# Patient Record
Sex: Male | Born: 1945
Health system: Southern US, Community
[De-identification: ages and names within clinical notes are randomized; demographics above are authoritative.]

## PROBLEM LIST (undated history)

## (undated) DIAGNOSIS — J449 Chronic obstructive pulmonary disease, unspecified: Secondary | ICD-10-CM

## (undated) DIAGNOSIS — I10 Essential (primary) hypertension: Secondary | ICD-10-CM

## (undated) DIAGNOSIS — J45909 Unspecified asthma, uncomplicated: Secondary | ICD-10-CM

## (undated) HISTORY — PX: TONSILLECTOMY: SUR1361

---

## 2006-12-04 ENCOUNTER — Ambulatory Visit: Payer: Self-pay | Admitting: Gastroenterology

## 2014-09-25 ENCOUNTER — Emergency Department: Payer: Self-pay | Admitting: Emergency Medicine

## 2014-09-25 LAB — BASIC METABOLIC PANEL WITH GFR
Anion Gap: 9
BUN: 21 mg/dL — ABNORMAL HIGH
Calcium, Total: 9.1 mg/dL
Chloride: 102 mmol/L
Co2: 27 mmol/L
Creatinine: 1.07 mg/dL
EGFR (African American): >60
EGFR (Non-African Amer.): >60
Glucose: 99 mg/dL
Osmolality: 279
Potassium: 3.5 mmol/L
Sodium: 138 mmol/L

## 2014-09-25 LAB — CBC
HCT: 49.5 %
HGB: 16.7 g/dL
MCH: 30.5 pg
MCHC: 33.7 g/dL
MCV: 91 fL
Platelet: 214 10*3/uL
RBC: 5.46 x10 6/mm 3
RDW: 12.8 %
WBC: 18 10*3/uL — ABNORMAL HIGH

## 2014-09-25 LAB — TROPONIN I: Troponin-I: <0.02 ng/mL

## 2015-08-21 DIAGNOSIS — J449 Chronic obstructive pulmonary disease, unspecified: Secondary | ICD-10-CM | POA: Diagnosis not present

## 2015-08-21 DIAGNOSIS — J45909 Unspecified asthma, uncomplicated: Secondary | ICD-10-CM | POA: Diagnosis not present

## 2015-09-21 DIAGNOSIS — J45909 Unspecified asthma, uncomplicated: Secondary | ICD-10-CM | POA: Diagnosis not present

## 2015-09-21 DIAGNOSIS — J449 Chronic obstructive pulmonary disease, unspecified: Secondary | ICD-10-CM | POA: Diagnosis not present

## 2015-10-19 DIAGNOSIS — J449 Chronic obstructive pulmonary disease, unspecified: Secondary | ICD-10-CM | POA: Diagnosis not present

## 2015-10-19 DIAGNOSIS — J45909 Unspecified asthma, uncomplicated: Secondary | ICD-10-CM | POA: Diagnosis not present

## 2015-11-06 DIAGNOSIS — E7800 Pure hypercholesterolemia, unspecified: Secondary | ICD-10-CM | POA: Diagnosis not present

## 2015-11-06 DIAGNOSIS — Z79899 Other long term (current) drug therapy: Secondary | ICD-10-CM | POA: Diagnosis not present

## 2015-11-15 DIAGNOSIS — Z79899 Other long term (current) drug therapy: Secondary | ICD-10-CM | POA: Diagnosis not present

## 2015-11-15 DIAGNOSIS — Z0000 Encounter for general adult medical examination without abnormal findings: Secondary | ICD-10-CM | POA: Diagnosis not present

## 2015-11-15 DIAGNOSIS — E7800 Pure hypercholesterolemia, unspecified: Secondary | ICD-10-CM | POA: Diagnosis not present

## 2015-11-19 DIAGNOSIS — J45909 Unspecified asthma, uncomplicated: Secondary | ICD-10-CM | POA: Diagnosis not present

## 2015-11-19 DIAGNOSIS — J449 Chronic obstructive pulmonary disease, unspecified: Secondary | ICD-10-CM | POA: Diagnosis not present

## 2015-11-30 DIAGNOSIS — H52223 Regular astigmatism, bilateral: Secondary | ICD-10-CM | POA: Diagnosis not present

## 2015-11-30 DIAGNOSIS — H5203 Hypermetropia, bilateral: Secondary | ICD-10-CM | POA: Diagnosis not present

## 2015-11-30 DIAGNOSIS — H524 Presbyopia: Secondary | ICD-10-CM | POA: Diagnosis not present

## 2015-12-13 ENCOUNTER — Encounter: Payer: Self-pay | Admitting: Pharmacist

## 2015-12-13 ENCOUNTER — Encounter (INDEPENDENT_AMBULATORY_CARE_PROVIDER_SITE_OTHER): Payer: Self-pay

## 2015-12-19 DIAGNOSIS — J449 Chronic obstructive pulmonary disease, unspecified: Secondary | ICD-10-CM | POA: Diagnosis not present

## 2015-12-19 DIAGNOSIS — J45909 Unspecified asthma, uncomplicated: Secondary | ICD-10-CM | POA: Diagnosis not present

## 2015-12-20 DIAGNOSIS — J449 Chronic obstructive pulmonary disease, unspecified: Secondary | ICD-10-CM | POA: Diagnosis not present

## 2015-12-20 DIAGNOSIS — J45909 Unspecified asthma, uncomplicated: Secondary | ICD-10-CM | POA: Diagnosis not present

## 2016-01-15 DIAGNOSIS — R972 Elevated prostate specific antigen [PSA]: Secondary | ICD-10-CM | POA: Diagnosis not present

## 2016-01-15 DIAGNOSIS — N401 Enlarged prostate with lower urinary tract symptoms: Secondary | ICD-10-CM | POA: Diagnosis not present

## 2016-01-15 DIAGNOSIS — R339 Retention of urine, unspecified: Secondary | ICD-10-CM | POA: Diagnosis not present

## 2016-01-15 DIAGNOSIS — N138 Other obstructive and reflux uropathy: Secondary | ICD-10-CM | POA: Diagnosis not present

## 2016-05-08 DIAGNOSIS — Z79899 Other long term (current) drug therapy: Secondary | ICD-10-CM | POA: Diagnosis not present

## 2016-05-08 DIAGNOSIS — E7800 Pure hypercholesterolemia, unspecified: Secondary | ICD-10-CM | POA: Diagnosis not present

## 2016-05-15 DIAGNOSIS — J449 Chronic obstructive pulmonary disease, unspecified: Secondary | ICD-10-CM | POA: Diagnosis not present

## 2016-05-15 DIAGNOSIS — Z79899 Other long term (current) drug therapy: Secondary | ICD-10-CM | POA: Diagnosis not present

## 2016-05-15 DIAGNOSIS — J301 Allergic rhinitis due to pollen: Secondary | ICD-10-CM | POA: Diagnosis not present

## 2016-05-15 DIAGNOSIS — I10 Essential (primary) hypertension: Secondary | ICD-10-CM | POA: Diagnosis not present

## 2016-05-15 DIAGNOSIS — N401 Enlarged prostate with lower urinary tract symptoms: Secondary | ICD-10-CM | POA: Diagnosis not present

## 2016-05-15 DIAGNOSIS — Z23 Encounter for immunization: Secondary | ICD-10-CM | POA: Diagnosis not present

## 2016-05-15 DIAGNOSIS — Z125 Encounter for screening for malignant neoplasm of prostate: Secondary | ICD-10-CM | POA: Diagnosis not present

## 2016-05-15 DIAGNOSIS — E7800 Pure hypercholesterolemia, unspecified: Secondary | ICD-10-CM | POA: Diagnosis not present

## 2016-07-24 DIAGNOSIS — J4520 Mild intermittent asthma, uncomplicated: Secondary | ICD-10-CM | POA: Diagnosis not present

## 2016-11-11 DIAGNOSIS — Z125 Encounter for screening for malignant neoplasm of prostate: Secondary | ICD-10-CM | POA: Diagnosis not present

## 2016-11-11 DIAGNOSIS — E7800 Pure hypercholesterolemia, unspecified: Secondary | ICD-10-CM | POA: Diagnosis not present

## 2016-11-11 DIAGNOSIS — Z79899 Other long term (current) drug therapy: Secondary | ICD-10-CM | POA: Diagnosis not present

## 2016-11-18 DIAGNOSIS — Z0000 Encounter for general adult medical examination without abnormal findings: Secondary | ICD-10-CM | POA: Diagnosis not present

## 2017-02-23 DIAGNOSIS — J449 Chronic obstructive pulmonary disease, unspecified: Secondary | ICD-10-CM | POA: Diagnosis not present

## 2017-02-23 DIAGNOSIS — J310 Chronic rhinitis: Secondary | ICD-10-CM | POA: Diagnosis not present

## 2017-02-23 DIAGNOSIS — R05 Cough: Secondary | ICD-10-CM | POA: Diagnosis not present

## 2017-02-23 DIAGNOSIS — K219 Gastro-esophageal reflux disease without esophagitis: Secondary | ICD-10-CM | POA: Diagnosis not present

## 2017-04-13 DIAGNOSIS — E7800 Pure hypercholesterolemia, unspecified: Secondary | ICD-10-CM | POA: Diagnosis not present

## 2017-04-13 DIAGNOSIS — I10 Essential (primary) hypertension: Secondary | ICD-10-CM | POA: Diagnosis not present

## 2017-04-13 DIAGNOSIS — H35033 Hypertensive retinopathy, bilateral: Secondary | ICD-10-CM | POA: Diagnosis not present

## 2017-04-13 DIAGNOSIS — H25813 Combined forms of age-related cataract, bilateral: Secondary | ICD-10-CM | POA: Diagnosis not present

## 2017-04-22 DIAGNOSIS — Z8719 Personal history of other diseases of the digestive system: Secondary | ICD-10-CM | POA: Diagnosis not present

## 2017-04-22 DIAGNOSIS — J310 Chronic rhinitis: Secondary | ICD-10-CM | POA: Diagnosis not present

## 2017-04-22 DIAGNOSIS — R05 Cough: Secondary | ICD-10-CM | POA: Diagnosis not present

## 2017-04-22 DIAGNOSIS — J449 Chronic obstructive pulmonary disease, unspecified: Secondary | ICD-10-CM | POA: Diagnosis not present

## 2017-04-29 DIAGNOSIS — H25041 Posterior subcapsular polar age-related cataract, right eye: Secondary | ICD-10-CM | POA: Diagnosis not present

## 2017-04-29 DIAGNOSIS — H25042 Posterior subcapsular polar age-related cataract, left eye: Secondary | ICD-10-CM | POA: Diagnosis not present

## 2017-05-06 DIAGNOSIS — H25812 Combined forms of age-related cataract, left eye: Secondary | ICD-10-CM | POA: Diagnosis not present

## 2017-05-06 DIAGNOSIS — H2512 Age-related nuclear cataract, left eye: Secondary | ICD-10-CM | POA: Diagnosis not present

## 2017-05-06 DIAGNOSIS — H25042 Posterior subcapsular polar age-related cataract, left eye: Secondary | ICD-10-CM | POA: Diagnosis not present

## 2017-05-13 DIAGNOSIS — E7800 Pure hypercholesterolemia, unspecified: Secondary | ICD-10-CM | POA: Diagnosis not present

## 2017-05-13 DIAGNOSIS — R739 Hyperglycemia, unspecified: Secondary | ICD-10-CM | POA: Diagnosis not present

## 2017-05-13 DIAGNOSIS — Z79899 Other long term (current) drug therapy: Secondary | ICD-10-CM | POA: Diagnosis not present

## 2017-05-19 DIAGNOSIS — N401 Enlarged prostate with lower urinary tract symptoms: Secondary | ICD-10-CM | POA: Diagnosis not present

## 2017-05-19 DIAGNOSIS — N138 Other obstructive and reflux uropathy: Secondary | ICD-10-CM | POA: Diagnosis not present

## 2017-05-19 DIAGNOSIS — R972 Elevated prostate specific antigen [PSA]: Secondary | ICD-10-CM | POA: Diagnosis not present

## 2017-05-19 DIAGNOSIS — R339 Retention of urine, unspecified: Secondary | ICD-10-CM | POA: Diagnosis not present

## 2017-05-21 DIAGNOSIS — I10 Essential (primary) hypertension: Secondary | ICD-10-CM | POA: Diagnosis not present

## 2017-05-21 DIAGNOSIS — Z79899 Other long term (current) drug therapy: Secondary | ICD-10-CM | POA: Diagnosis not present

## 2017-05-21 DIAGNOSIS — F419 Anxiety disorder, unspecified: Secondary | ICD-10-CM | POA: Diagnosis not present

## 2017-05-21 DIAGNOSIS — N401 Enlarged prostate with lower urinary tract symptoms: Secondary | ICD-10-CM | POA: Diagnosis not present

## 2017-05-21 DIAGNOSIS — J449 Chronic obstructive pulmonary disease, unspecified: Secondary | ICD-10-CM | POA: Diagnosis not present

## 2017-05-21 DIAGNOSIS — Z23 Encounter for immunization: Secondary | ICD-10-CM | POA: Diagnosis not present

## 2017-05-21 DIAGNOSIS — E7800 Pure hypercholesterolemia, unspecified: Secondary | ICD-10-CM | POA: Diagnosis not present

## 2017-05-21 DIAGNOSIS — R351 Nocturia: Secondary | ICD-10-CM | POA: Diagnosis not present

## 2017-05-28 DIAGNOSIS — Z01818 Encounter for other preprocedural examination: Secondary | ICD-10-CM | POA: Diagnosis not present

## 2017-05-28 DIAGNOSIS — H25041 Posterior subcapsular polar age-related cataract, right eye: Secondary | ICD-10-CM | POA: Diagnosis not present

## 2017-05-28 DIAGNOSIS — H2511 Age-related nuclear cataract, right eye: Secondary | ICD-10-CM | POA: Diagnosis not present

## 2017-06-25 DIAGNOSIS — E7800 Pure hypercholesterolemia, unspecified: Secondary | ICD-10-CM | POA: Diagnosis not present

## 2017-06-25 DIAGNOSIS — Z125 Encounter for screening for malignant neoplasm of prostate: Secondary | ICD-10-CM | POA: Diagnosis not present

## 2017-06-25 DIAGNOSIS — Z79899 Other long term (current) drug therapy: Secondary | ICD-10-CM | POA: Diagnosis not present

## 2017-06-25 DIAGNOSIS — F419 Anxiety disorder, unspecified: Secondary | ICD-10-CM | POA: Diagnosis not present

## 2017-07-07 DIAGNOSIS — Z961 Presence of intraocular lens: Secondary | ICD-10-CM | POA: Diagnosis not present

## 2017-09-01 DIAGNOSIS — J449 Chronic obstructive pulmonary disease, unspecified: Secondary | ICD-10-CM | POA: Diagnosis not present

## 2017-11-16 DIAGNOSIS — Z79899 Other long term (current) drug therapy: Secondary | ICD-10-CM | POA: Diagnosis not present

## 2017-11-16 DIAGNOSIS — E7800 Pure hypercholesterolemia, unspecified: Secondary | ICD-10-CM | POA: Diagnosis not present

## 2017-11-16 DIAGNOSIS — Z125 Encounter for screening for malignant neoplasm of prostate: Secondary | ICD-10-CM | POA: Diagnosis not present

## 2017-11-23 DIAGNOSIS — Z0000 Encounter for general adult medical examination without abnormal findings: Secondary | ICD-10-CM | POA: Diagnosis not present

## 2018-03-02 DIAGNOSIS — J449 Chronic obstructive pulmonary disease, unspecified: Secondary | ICD-10-CM | POA: Diagnosis not present

## 2018-03-02 DIAGNOSIS — J310 Chronic rhinitis: Secondary | ICD-10-CM | POA: Diagnosis not present

## 2018-04-27 ENCOUNTER — Emergency Department
Admission: EM | Admit: 2018-04-27 | Discharge: 2018-04-27 | Disposition: A | Discharge status: Home / Self Care | Payer: No Typology Code available for payment source | Attending: Emergency Medicine | Admitting: Emergency Medicine

## 2018-04-27 ENCOUNTER — Other Ambulatory Visit: Payer: Self-pay

## 2018-04-27 ENCOUNTER — Encounter: Payer: Self-pay | Admitting: Emergency Medicine

## 2018-04-27 ENCOUNTER — Emergency Department: Payer: No Typology Code available for payment source

## 2018-04-27 DIAGNOSIS — M542 Cervicalgia: Secondary | ICD-10-CM | POA: Diagnosis not present

## 2018-04-27 DIAGNOSIS — Y999 Unspecified external cause status: Secondary | ICD-10-CM | POA: Diagnosis not present

## 2018-04-27 DIAGNOSIS — S4992XA Unspecified injury of left shoulder and upper arm, initial encounter: Secondary | ICD-10-CM | POA: Diagnosis not present

## 2018-04-27 DIAGNOSIS — Y9389 Activity, other specified: Secondary | ICD-10-CM | POA: Diagnosis not present

## 2018-04-27 DIAGNOSIS — Y92410 Unspecified street and highway as the place of occurrence of the external cause: Secondary | ICD-10-CM | POA: Insufficient documentation

## 2018-04-27 DIAGNOSIS — S199XXA Unspecified injury of neck, initial encounter: Secondary | ICD-10-CM | POA: Diagnosis not present

## 2018-04-27 DIAGNOSIS — M25512 Pain in left shoulder: Secondary | ICD-10-CM | POA: Diagnosis not present

## 2018-04-27 DIAGNOSIS — S50812A Abrasion of left forearm, initial encounter: Secondary | ICD-10-CM | POA: Diagnosis not present

## 2018-04-27 DIAGNOSIS — S5012XA Contusion of left forearm, initial encounter: Secondary | ICD-10-CM | POA: Diagnosis not present

## 2018-04-27 DIAGNOSIS — S161XXA Strain of muscle, fascia and tendon at neck level, initial encounter: Secondary | ICD-10-CM | POA: Diagnosis not present

## 2018-04-27 DIAGNOSIS — Z23 Encounter for immunization: Secondary | ICD-10-CM | POA: Diagnosis not present

## 2018-04-27 DIAGNOSIS — S59912A Unspecified injury of left forearm, initial encounter: Secondary | ICD-10-CM | POA: Diagnosis present

## 2018-04-27 HISTORY — DX: Unspecified asthma, uncomplicated: J45.909

## 2018-04-27 HISTORY — DX: Essential (primary) hypertension: I10

## 2018-04-27 MED ORDER — TETANUS-DIPHTH-ACELL PERTUSSIS 5-2.5-18.5 LF-MCG/0.5 IM SUSP
0.5000 mL | Freq: Once | INTRAMUSCULAR | Status: AC
  Administered 2018-04-27: 0.5 mL via INTRAMUSCULAR
  Filled 2018-04-27: qty 0.5
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
## 2018-04-28 ENCOUNTER — Other Ambulatory Visit: Payer: Self-pay | Admitting: Pharmacist
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
## 2018-04-30 DIAGNOSIS — M542 Cervicalgia: Secondary | ICD-10-CM | POA: Diagnosis not present

## 2018-04-30 DIAGNOSIS — S1093XD Contusion of unspecified part of neck, subsequent encounter: Secondary | ICD-10-CM | POA: Diagnosis not present

## 2018-04-30 DIAGNOSIS — R079 Chest pain, unspecified: Secondary | ICD-10-CM | POA: Diagnosis not present

## 2018-05-21 DIAGNOSIS — E7800 Pure hypercholesterolemia, unspecified: Secondary | ICD-10-CM | POA: Diagnosis not present

## 2018-05-21 DIAGNOSIS — Z79899 Other long term (current) drug therapy: Secondary | ICD-10-CM | POA: Diagnosis not present

## 2018-05-21 DIAGNOSIS — R739 Hyperglycemia, unspecified: Secondary | ICD-10-CM | POA: Diagnosis not present

## 2018-05-25 DIAGNOSIS — R739 Hyperglycemia, unspecified: Secondary | ICD-10-CM | POA: Diagnosis not present

## 2018-05-25 DIAGNOSIS — I10 Essential (primary) hypertension: Secondary | ICD-10-CM | POA: Diagnosis not present

## 2018-05-25 DIAGNOSIS — N401 Enlarged prostate with lower urinary tract symptoms: Secondary | ICD-10-CM | POA: Diagnosis not present

## 2018-05-25 DIAGNOSIS — Z79899 Other long term (current) drug therapy: Secondary | ICD-10-CM | POA: Diagnosis not present

## 2018-05-25 DIAGNOSIS — J449 Chronic obstructive pulmonary disease, unspecified: Secondary | ICD-10-CM | POA: Diagnosis not present

## 2018-05-25 DIAGNOSIS — F419 Anxiety disorder, unspecified: Secondary | ICD-10-CM | POA: Diagnosis not present

## 2018-05-25 DIAGNOSIS — R351 Nocturia: Secondary | ICD-10-CM | POA: Diagnosis not present

## 2018-05-25 DIAGNOSIS — Z23 Encounter for immunization: Secondary | ICD-10-CM | POA: Diagnosis not present

## 2018-06-24 DIAGNOSIS — N400 Enlarged prostate without lower urinary tract symptoms: Secondary | ICD-10-CM | POA: Diagnosis not present

## 2018-06-24 DIAGNOSIS — R972 Elevated prostate specific antigen [PSA]: Secondary | ICD-10-CM | POA: Diagnosis not present

## 2018-09-21 DIAGNOSIS — R0602 Shortness of breath: Secondary | ICD-10-CM | POA: Diagnosis not present

## 2018-09-21 DIAGNOSIS — R05 Cough: Secondary | ICD-10-CM | POA: Diagnosis not present

## 2018-09-21 DIAGNOSIS — J441 Chronic obstructive pulmonary disease with (acute) exacerbation: Secondary | ICD-10-CM | POA: Diagnosis not present

## 2018-11-22 DIAGNOSIS — N3281 Overactive bladder: Secondary | ICD-10-CM | POA: Diagnosis not present

## 2018-11-22 DIAGNOSIS — E559 Vitamin D deficiency, unspecified: Secondary | ICD-10-CM | POA: Diagnosis not present

## 2018-11-22 DIAGNOSIS — I10 Essential (primary) hypertension: Secondary | ICD-10-CM | POA: Diagnosis not present

## 2018-11-22 DIAGNOSIS — Z853 Personal history of malignant neoplasm of breast: Secondary | ICD-10-CM | POA: Diagnosis not present

## 2018-11-22 DIAGNOSIS — M159 Polyosteoarthritis, unspecified: Secondary | ICD-10-CM | POA: Diagnosis not present

## 2018-11-22 DIAGNOSIS — Z79899 Other long term (current) drug therapy: Secondary | ICD-10-CM | POA: Diagnosis not present

## 2018-11-22 DIAGNOSIS — E785 Hyperlipidemia, unspecified: Secondary | ICD-10-CM | POA: Diagnosis not present

## 2018-11-22 DIAGNOSIS — J309 Allergic rhinitis, unspecified: Secondary | ICD-10-CM | POA: Diagnosis not present

## 2018-11-22 DIAGNOSIS — B352 Tinea manuum: Secondary | ICD-10-CM | POA: Diagnosis not present

## 2018-11-22 DIAGNOSIS — R972 Elevated prostate specific antigen [PSA]: Secondary | ICD-10-CM | POA: Diagnosis not present

## 2018-11-22 DIAGNOSIS — I2609 Other pulmonary embolism with acute cor pulmonale: Secondary | ICD-10-CM | POA: Diagnosis not present

## 2018-11-22 DIAGNOSIS — M810 Age-related osteoporosis without current pathological fracture: Secondary | ICD-10-CM | POA: Diagnosis not present

## 2018-11-22 DIAGNOSIS — R739 Hyperglycemia, unspecified: Secondary | ICD-10-CM | POA: Diagnosis not present

## 2018-11-22 DIAGNOSIS — Z20828 Contact with and (suspected) exposure to other viral communicable diseases: Secondary | ICD-10-CM | POA: Diagnosis not present

## 2018-11-22 DIAGNOSIS — L0100 Impetigo, unspecified: Secondary | ICD-10-CM | POA: Diagnosis not present

## 2018-11-22 DIAGNOSIS — L309 Dermatitis, unspecified: Secondary | ICD-10-CM | POA: Diagnosis not present

## 2018-11-25 DIAGNOSIS — J449 Chronic obstructive pulmonary disease, unspecified: Secondary | ICD-10-CM | POA: Diagnosis not present

## 2018-11-25 DIAGNOSIS — G250 Essential tremor: Secondary | ICD-10-CM | POA: Diagnosis not present

## 2018-11-25 DIAGNOSIS — E113513 Type 2 diabetes mellitus with proliferative diabetic retinopathy with macular edema, bilateral: Secondary | ICD-10-CM | POA: Diagnosis not present

## 2018-11-25 DIAGNOSIS — R739 Hyperglycemia, unspecified: Secondary | ICD-10-CM | POA: Diagnosis not present

## 2018-11-25 DIAGNOSIS — H35033 Hypertensive retinopathy, bilateral: Secondary | ICD-10-CM | POA: Diagnosis not present

## 2018-11-25 DIAGNOSIS — I10 Essential (primary) hypertension: Secondary | ICD-10-CM | POA: Diagnosis not present

## 2018-11-25 DIAGNOSIS — E7800 Pure hypercholesterolemia, unspecified: Secondary | ICD-10-CM | POA: Diagnosis not present

## 2018-11-25 DIAGNOSIS — E11311 Type 2 diabetes mellitus with unspecified diabetic retinopathy with macular edema: Secondary | ICD-10-CM | POA: Diagnosis not present

## 2018-11-25 DIAGNOSIS — H43813 Vitreous degeneration, bilateral: Secondary | ICD-10-CM | POA: Diagnosis not present

## 2018-12-21 DIAGNOSIS — J449 Chronic obstructive pulmonary disease, unspecified: Secondary | ICD-10-CM | POA: Diagnosis not present

## 2019-05-03 DIAGNOSIS — J441 Chronic obstructive pulmonary disease with (acute) exacerbation: Secondary | ICD-10-CM | POA: Diagnosis not present

## 2019-05-03 DIAGNOSIS — R0602 Shortness of breath: Secondary | ICD-10-CM | POA: Diagnosis not present

## 2019-05-03 DIAGNOSIS — J45901 Unspecified asthma with (acute) exacerbation: Secondary | ICD-10-CM | POA: Diagnosis not present
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
## 2019-05-23 DIAGNOSIS — R739 Hyperglycemia, unspecified: Secondary | ICD-10-CM | POA: Diagnosis not present

## 2019-05-23 DIAGNOSIS — Z79899 Other long term (current) drug therapy: Secondary | ICD-10-CM | POA: Diagnosis not present

## 2019-05-23 DIAGNOSIS — Z125 Encounter for screening for malignant neoplasm of prostate: Secondary | ICD-10-CM | POA: Diagnosis not present

## 2019-05-23 DIAGNOSIS — E7800 Pure hypercholesterolemia, unspecified: Secondary | ICD-10-CM | POA: Diagnosis not present

## 2019-05-27 DIAGNOSIS — Z23 Encounter for immunization: Secondary | ICD-10-CM | POA: Diagnosis not present

## 2019-05-27 DIAGNOSIS — Z0000 Encounter for general adult medical examination without abnormal findings: Secondary | ICD-10-CM | POA: Diagnosis not present

## 2019-05-27 DIAGNOSIS — E7800 Pure hypercholesterolemia, unspecified: Secondary | ICD-10-CM | POA: Diagnosis not present

## 2019-05-27 DIAGNOSIS — I10 Essential (primary) hypertension: Secondary | ICD-10-CM | POA: Diagnosis not present

## 2019-05-27 DIAGNOSIS — R739 Hyperglycemia, unspecified: Secondary | ICD-10-CM | POA: Diagnosis not present

## 2019-05-27 DIAGNOSIS — J449 Chronic obstructive pulmonary disease, unspecified: Secondary | ICD-10-CM | POA: Diagnosis not present

## 2019-05-27 DIAGNOSIS — Z79899 Other long term (current) drug therapy: Secondary | ICD-10-CM | POA: Diagnosis not present

## 2019-05-27 DIAGNOSIS — Z1331 Encounter for screening for depression: Secondary | ICD-10-CM | POA: Diagnosis not present

## 2019-08-22 DIAGNOSIS — J45901 Unspecified asthma with (acute) exacerbation: Secondary | ICD-10-CM | POA: Diagnosis not present

## 2019-08-22 DIAGNOSIS — U071 COVID-19: Secondary | ICD-10-CM | POA: Diagnosis not present

## 2019-08-22 DIAGNOSIS — R05 Cough: Secondary | ICD-10-CM | POA: Diagnosis not present

## 2019-09-22 DIAGNOSIS — J449 Chronic obstructive pulmonary disease, unspecified: Secondary | ICD-10-CM | POA: Diagnosis not present

## 2019-09-22 DIAGNOSIS — R0609 Other forms of dyspnea: Secondary | ICD-10-CM | POA: Diagnosis not present

## 2019-09-22 DIAGNOSIS — R0600 Dyspnea, unspecified: Secondary | ICD-10-CM | POA: Diagnosis not present

## 2019-09-22 DIAGNOSIS — Z8616 Personal history of COVID-19: Secondary | ICD-10-CM | POA: Diagnosis not present

## 2019-11-17 DIAGNOSIS — Z79899 Other long term (current) drug therapy: Secondary | ICD-10-CM | POA: Diagnosis not present

## 2019-11-17 DIAGNOSIS — R739 Hyperglycemia, unspecified: Secondary | ICD-10-CM | POA: Diagnosis not present

## 2019-11-17 DIAGNOSIS — E7800 Pure hypercholesterolemia, unspecified: Secondary | ICD-10-CM | POA: Diagnosis not present

## 2019-11-25 DIAGNOSIS — E7800 Pure hypercholesterolemia, unspecified: Secondary | ICD-10-CM | POA: Diagnosis not present

## 2019-11-25 DIAGNOSIS — R0600 Dyspnea, unspecified: Secondary | ICD-10-CM | POA: Diagnosis not present

## 2019-11-25 DIAGNOSIS — J449 Chronic obstructive pulmonary disease, unspecified: Secondary | ICD-10-CM | POA: Diagnosis not present

## 2019-11-25 DIAGNOSIS — N401 Enlarged prostate with lower urinary tract symptoms: Secondary | ICD-10-CM | POA: Diagnosis not present

## 2019-11-25 DIAGNOSIS — I10 Essential (primary) hypertension: Secondary | ICD-10-CM | POA: Diagnosis not present

## 2019-11-25 DIAGNOSIS — Z125 Encounter for screening for malignant neoplasm of prostate: Secondary | ICD-10-CM | POA: Diagnosis not present

## 2019-11-25 DIAGNOSIS — R739 Hyperglycemia, unspecified: Secondary | ICD-10-CM | POA: Diagnosis not present

## 2019-11-25 DIAGNOSIS — R079 Chest pain, unspecified: Secondary | ICD-10-CM | POA: Diagnosis not present

## 2019-11-25 DIAGNOSIS — Z79899 Other long term (current) drug therapy: Secondary | ICD-10-CM | POA: Diagnosis not present

## 2019-11-28 DIAGNOSIS — R079 Chest pain, unspecified: Secondary | ICD-10-CM | POA: Diagnosis not present

## 2019-11-28 DIAGNOSIS — R0600 Dyspnea, unspecified: Secondary | ICD-10-CM | POA: Diagnosis not present

## 2019-12-01 DIAGNOSIS — J449 Chronic obstructive pulmonary disease, unspecified: Secondary | ICD-10-CM | POA: Diagnosis not present

## 2019-12-01 DIAGNOSIS — R0602 Shortness of breath: Secondary | ICD-10-CM | POA: Diagnosis not present

## 2019-12-01 DIAGNOSIS — J42 Unspecified chronic bronchitis: Secondary | ICD-10-CM | POA: Diagnosis not present

## 2019-12-01 DIAGNOSIS — R0789 Other chest pain: Secondary | ICD-10-CM | POA: Diagnosis not present

## 2019-12-01 DIAGNOSIS — I10 Essential (primary) hypertension: Secondary | ICD-10-CM | POA: Diagnosis not present

## 2019-12-29 DIAGNOSIS — R0789 Other chest pain: Secondary | ICD-10-CM | POA: Diagnosis not present

## 2019-12-29 DIAGNOSIS — R0602 Shortness of breath: Secondary | ICD-10-CM | POA: Diagnosis not present

## 2020-01-04 DIAGNOSIS — R0600 Dyspnea, unspecified: Secondary | ICD-10-CM | POA: Diagnosis not present

## 2020-01-04 DIAGNOSIS — I10 Essential (primary) hypertension: Secondary | ICD-10-CM | POA: Diagnosis not present

## 2020-01-04 DIAGNOSIS — Z6831 Body mass index (BMI) 31.0-31.9, adult: Secondary | ICD-10-CM | POA: Diagnosis not present

## 2020-01-04 DIAGNOSIS — J449 Chronic obstructive pulmonary disease, unspecified: Secondary | ICD-10-CM | POA: Diagnosis not present

## 2020-01-04 DIAGNOSIS — E6609 Other obesity due to excess calories: Secondary | ICD-10-CM | POA: Diagnosis not present

## 2020-01-04 DIAGNOSIS — E7849 Other hyperlipidemia: Secondary | ICD-10-CM | POA: Diagnosis not present

## 2020-01-26 DIAGNOSIS — J449 Chronic obstructive pulmonary disease, unspecified: Secondary | ICD-10-CM | POA: Diagnosis not present

## 2020-01-26 DIAGNOSIS — R0600 Dyspnea, unspecified: Secondary | ICD-10-CM | POA: Diagnosis not present

## 2020-01-26 DIAGNOSIS — Z789 Other specified health status: Secondary | ICD-10-CM | POA: Diagnosis not present

## 2020-04-04 DIAGNOSIS — I10 Essential (primary) hypertension: Secondary | ICD-10-CM | POA: Diagnosis not present

## 2020-04-04 DIAGNOSIS — E6609 Other obesity due to excess calories: Secondary | ICD-10-CM | POA: Diagnosis not present

## 2020-04-04 DIAGNOSIS — E7849 Other hyperlipidemia: Secondary | ICD-10-CM | POA: Diagnosis not present

## 2020-04-04 DIAGNOSIS — Z6831 Body mass index (BMI) 31.0-31.9, adult: Secondary | ICD-10-CM | POA: Diagnosis not present

## 2020-04-04 DIAGNOSIS — N401 Enlarged prostate with lower urinary tract symptoms: Secondary | ICD-10-CM | POA: Diagnosis not present

## 2020-04-04 DIAGNOSIS — J449 Chronic obstructive pulmonary disease, unspecified: Secondary | ICD-10-CM | POA: Diagnosis not present

## 2020-04-27 DIAGNOSIS — J449 Chronic obstructive pulmonary disease, unspecified: Secondary | ICD-10-CM | POA: Diagnosis not present

## 2020-04-27 DIAGNOSIS — R0600 Dyspnea, unspecified: Secondary | ICD-10-CM | POA: Diagnosis not present

## 2020-05-21 DIAGNOSIS — Z125 Encounter for screening for malignant neoplasm of prostate: Secondary | ICD-10-CM | POA: Diagnosis not present

## 2020-05-21 DIAGNOSIS — R739 Hyperglycemia, unspecified: Secondary | ICD-10-CM | POA: Diagnosis not present

## 2020-05-21 DIAGNOSIS — Z79899 Other long term (current) drug therapy: Secondary | ICD-10-CM | POA: Diagnosis not present

## 2020-05-21 DIAGNOSIS — E7800 Pure hypercholesterolemia, unspecified: Secondary | ICD-10-CM | POA: Diagnosis not present

## 2020-05-28 DIAGNOSIS — Z0000 Encounter for general adult medical examination without abnormal findings: Secondary | ICD-10-CM | POA: Diagnosis not present

## 2020-05-28 DIAGNOSIS — Z23 Encounter for immunization: Secondary | ICD-10-CM | POA: Diagnosis not present

## 2020-05-28 DIAGNOSIS — Z1331 Encounter for screening for depression: Secondary | ICD-10-CM | POA: Diagnosis not present

## 2020-07-27 DIAGNOSIS — E6609 Other obesity due to excess calories: Secondary | ICD-10-CM | POA: Diagnosis not present

## 2020-07-27 DIAGNOSIS — R0600 Dyspnea, unspecified: Secondary | ICD-10-CM | POA: Diagnosis not present

## 2020-07-27 DIAGNOSIS — Z6831 Body mass index (BMI) 31.0-31.9, adult: Secondary | ICD-10-CM | POA: Diagnosis not present

## 2020-07-27 DIAGNOSIS — J449 Chronic obstructive pulmonary disease, unspecified: Secondary | ICD-10-CM | POA: Diagnosis not present

## 2020-09-18 DIAGNOSIS — I10 Essential (primary) hypertension: Secondary | ICD-10-CM | POA: Diagnosis not present

## 2020-09-18 DIAGNOSIS — E7849 Other hyperlipidemia: Secondary | ICD-10-CM | POA: Diagnosis not present

## 2020-09-18 DIAGNOSIS — J449 Chronic obstructive pulmonary disease, unspecified: Secondary | ICD-10-CM | POA: Diagnosis not present

## 2020-09-18 DIAGNOSIS — Z6831 Body mass index (BMI) 31.0-31.9, adult: Secondary | ICD-10-CM | POA: Diagnosis not present

## 2020-09-18 DIAGNOSIS — E6609 Other obesity due to excess calories: Secondary | ICD-10-CM | POA: Diagnosis not present

## 2020-11-19 DIAGNOSIS — E7800 Pure hypercholesterolemia, unspecified: Secondary | ICD-10-CM | POA: Diagnosis not present

## 2020-11-19 DIAGNOSIS — Z79899 Other long term (current) drug therapy: Secondary | ICD-10-CM | POA: Diagnosis not present

## 2020-11-19 DIAGNOSIS — R739 Hyperglycemia, unspecified: Secondary | ICD-10-CM | POA: Diagnosis not present

## 2020-11-26 DIAGNOSIS — R972 Elevated prostate specific antigen [PSA]: Secondary | ICD-10-CM | POA: Diagnosis not present

## 2020-11-26 DIAGNOSIS — E7800 Pure hypercholesterolemia, unspecified: Secondary | ICD-10-CM | POA: Diagnosis not present

## 2020-11-26 DIAGNOSIS — J449 Chronic obstructive pulmonary disease, unspecified: Secondary | ICD-10-CM | POA: Diagnosis not present

## 2020-11-26 DIAGNOSIS — I10 Essential (primary) hypertension: Secondary | ICD-10-CM | POA: Diagnosis not present

## 2020-11-26 DIAGNOSIS — Z125 Encounter for screening for malignant neoplasm of prostate: Secondary | ICD-10-CM | POA: Diagnosis not present

## 2020-11-26 DIAGNOSIS — N400 Enlarged prostate without lower urinary tract symptoms: Secondary | ICD-10-CM | POA: Diagnosis not present

## 2020-11-26 DIAGNOSIS — R739 Hyperglycemia, unspecified: Secondary | ICD-10-CM | POA: Diagnosis not present

## 2020-11-26 DIAGNOSIS — Z79899 Other long term (current) drug therapy: Secondary | ICD-10-CM | POA: Diagnosis not present

## 2020-11-26 DIAGNOSIS — F419 Anxiety disorder, unspecified: Secondary | ICD-10-CM | POA: Diagnosis not present

## 2020-12-06 DIAGNOSIS — R0600 Dyspnea, unspecified: Secondary | ICD-10-CM | POA: Diagnosis not present

## 2020-12-06 DIAGNOSIS — J439 Emphysema, unspecified: Secondary | ICD-10-CM | POA: Diagnosis not present

## 2021-01-07 ENCOUNTER — Inpatient Hospital Stay
Admit: 2021-01-07 | Discharge: 2021-01-07 | Disposition: A | Discharge status: Home / Self Care | Payer: PPO | Attending: Internal Medicine | Admitting: Internal Medicine

## 2021-01-07 ENCOUNTER — Other Ambulatory Visit: Payer: Self-pay

## 2021-01-07 ENCOUNTER — Observation Stay
Admission: EM | Admit: 2021-01-07 | Discharge: 2021-01-08 | Disposition: A | Discharge status: Home / Self Care | Payer: PPO | Attending: Internal Medicine | Admitting: Internal Medicine

## 2021-01-07 ENCOUNTER — Emergency Department: Payer: PPO

## 2021-01-07 DIAGNOSIS — J441 Chronic obstructive pulmonary disease with (acute) exacerbation: Secondary | ICD-10-CM | POA: Diagnosis not present

## 2021-01-07 DIAGNOSIS — J45909 Unspecified asthma, uncomplicated: Secondary | ICD-10-CM | POA: Diagnosis not present

## 2021-01-07 DIAGNOSIS — J440 Chronic obstructive pulmonary disease with acute lower respiratory infection: Secondary | ICD-10-CM

## 2021-01-07 DIAGNOSIS — I160 Hypertensive urgency: Secondary | ICD-10-CM | POA: Diagnosis not present

## 2021-01-07 DIAGNOSIS — R0689 Other abnormalities of breathing: Secondary | ICD-10-CM | POA: Diagnosis not present

## 2021-01-07 DIAGNOSIS — Z79899 Other long term (current) drug therapy: Secondary | ICD-10-CM | POA: Insufficient documentation

## 2021-01-07 DIAGNOSIS — E785 Hyperlipidemia, unspecified: Secondary | ICD-10-CM

## 2021-01-07 DIAGNOSIS — J449 Chronic obstructive pulmonary disease, unspecified: Secondary | ICD-10-CM | POA: Diagnosis present

## 2021-01-07 DIAGNOSIS — R0902 Hypoxemia: Secondary | ICD-10-CM | POA: Diagnosis not present

## 2021-01-07 DIAGNOSIS — R062 Wheezing: Secondary | ICD-10-CM | POA: Diagnosis not present

## 2021-01-07 DIAGNOSIS — I10 Essential (primary) hypertension: Secondary | ICD-10-CM | POA: Diagnosis not present

## 2021-01-07 DIAGNOSIS — I6359 Cerebral infarction due to unspecified occlusion or stenosis of other cerebral artery: Secondary | ICD-10-CM | POA: Diagnosis not present

## 2021-01-07 DIAGNOSIS — J209 Acute bronchitis, unspecified: Secondary | ICD-10-CM

## 2021-01-07 DIAGNOSIS — R0602 Shortness of breath: Secondary | ICD-10-CM | POA: Diagnosis present

## 2021-01-07 DIAGNOSIS — Z20822 Contact with and (suspected) exposure to covid-19: Secondary | ICD-10-CM | POA: Insufficient documentation

## 2021-01-07 DIAGNOSIS — R0603 Acute respiratory distress: Secondary | ICD-10-CM | POA: Diagnosis not present

## 2021-01-07 HISTORY — DX: Chronic obstructive pulmonary disease, unspecified: J44.9

## 2021-01-07 LAB — CBC
HCT: 46.7 % (ref 39.0–52.0)
Hemoglobin: 16.3 g/dL (ref 13.0–17.0)
MCH: 32.6 pg (ref 26.0–34.0)
MCHC: 34.9 g/dL (ref 30.0–36.0)
MCV: 93.4 fL (ref 80.0–100.0)
Platelets: 165 10*3/uL (ref 150–400)
RBC: 5 MIL/uL (ref 4.22–5.81)
RDW: 13 % (ref 11.5–15.5)
WBC: 9.9 10*3/uL (ref 4.0–10.5)
nRBC: 0 % (ref 0.0–0.2)

## 2021-01-07 LAB — BASIC METABOLIC PANEL WITH GFR
Anion gap: 9 (ref 5–15)
Anion gap: 10 (ref 5–15)
BUN: 16 mg/dL (ref 8–23)
BUN: 17 mg/dL (ref 8–23)
CO2: 28 mmol/L (ref 22–32)
CO2: 24 mmol/L (ref 22–32)
Calcium: 9.1 mg/dL (ref 8.9–10.3)
Calcium: 8.8 mg/dL — ABNORMAL LOW (ref 8.9–10.3)
Chloride: 101 mmol/L (ref 98–111)
Chloride: 102 mmol/L (ref 98–111)
Creatinine, Ser: 1.05 mg/dL (ref 0.61–1.24)
Creatinine, Ser: 1.02 mg/dL (ref 0.61–1.24)
GFR, Estimated: >60 mL/min
GFR, Estimated: >60 mL/min
Glucose, Bld: 124 mg/dL — ABNORMAL HIGH (ref 70–99)
Glucose, Bld: 158 mg/dL — ABNORMAL HIGH (ref 70–99)
Potassium: 3.8 mmol/L (ref 3.5–5.1)
Potassium: 3.8 mmol/L (ref 3.5–5.1)
Sodium: 138 mmol/L (ref 135–145)
Sodium: 136 mmol/L (ref 135–145)

## 2021-01-07 LAB — CBC WITH DIFFERENTIAL/PLATELET
Abs Immature Granulocytes: 0.07 10*3/uL (ref 0.00–0.07)
Basophils Absolute: 0.1 10*3/uL (ref 0.0–0.1)
Basophils Relative: 1 %
Eosinophils Absolute: 1.2 10*3/uL — ABNORMAL HIGH (ref 0.0–0.5)
Eosinophils Relative: 9 %
HCT: 48.6 % (ref 39.0–52.0)
Hemoglobin: 16.4 g/dL (ref 13.0–17.0)
Immature Granulocytes: 1 %
Lymphocytes Relative: 12 %
Lymphs Abs: 1.5 10*3/uL (ref 0.7–4.0)
MCH: 32 pg (ref 26.0–34.0)
MCHC: 33.7 g/dL (ref 30.0–36.0)
MCV: 94.7 fL (ref 80.0–100.0)
Monocytes Absolute: 1.8 10*3/uL — ABNORMAL HIGH (ref 0.1–1.0)
Monocytes Relative: 15 %
Neutro Abs: 7.7 10*3/uL (ref 1.7–7.7)
Neutrophils Relative %: 62 %
Platelets: 168 10*3/uL (ref 150–400)
RBC: 5.13 MIL/uL (ref 4.22–5.81)
RDW: 12.9 % (ref 11.5–15.5)
WBC: 12.3 10*3/uL — ABNORMAL HIGH (ref 4.0–10.5)
nRBC: 0 % (ref 0.0–0.2)

## 2021-01-07 LAB — TROPONIN I (HIGH SENSITIVITY)
Troponin I (High Sensitivity): 50 ng/L — ABNORMAL HIGH
Troponin I (High Sensitivity): 51 ng/L — ABNORMAL HIGH

## 2021-01-07 LAB — BRAIN NATRIURETIC PEPTIDE: B Natriuretic Peptide: 83.7 pg/mL (ref 0.0–100.0)

## 2021-01-07 LAB — RESP PANEL BY RT-PCR (FLU A&B, COVID) ARPGX2
Influenza A by PCR: NEGATIVE
Influenza B by PCR: NEGATIVE
SARS Coronavirus 2 by RT PCR: NEGATIVE

## 2021-01-07 LAB — HEMOGLOBIN A1C
Hgb A1c MFr Bld: 6.2 % — ABNORMAL HIGH (ref 4.8–5.6)
Mean Plasma Glucose: 131.24 mg/dL

## 2021-01-07 LAB — PROCALCITONIN: Procalcitonin: <0.1 ng/mL

## 2021-01-07 MED ORDER — DM-GUAIFENESIN ER 30-600 MG PO TB12: 1.0000 | ORAL_TABLET | Freq: Two times a day (BID) | ORAL | Status: DC | PRN

## 2021-01-07 MED ORDER — TAMSULOSIN HCL 0.4 MG PO CAPS
0.4000 mg | ORAL_CAPSULE | Freq: Every day | ORAL | Status: DC
  Administered 2021-01-07 – 2021-01-08 (×2): 0.4 mg via ORAL
  Filled 2021-01-07 (×2): qty 1

## 2021-01-07 MED ORDER — ENOXAPARIN SODIUM 60 MG/0.6ML IJ SOSY
0.5000 mg/kg | PREFILLED_SYRINGE | INTRAMUSCULAR | Status: DC
  Filled 2021-01-07 (×2): qty 0.6

## 2021-01-07 MED ORDER — IPRATROPIUM-ALBUTEROL 0.5-2.5 (3) MG/3ML IN SOLN
3.0000 mL | Freq: Three times a day (TID) | RESPIRATORY_TRACT | Status: DC
  Administered 2021-01-07 – 2021-01-08 (×4): 3 mL via RESPIRATORY_TRACT
  Filled 2021-01-07 (×4): qty 3

## 2021-01-07 MED ORDER — GUAIFENESIN ER 600 MG PO TB12
600.0000 mg | ORAL_TABLET | Freq: Two times a day (BID) | ORAL | Status: DC
  Administered 2021-01-07 – 2021-01-08 (×3): 600 mg via ORAL
  Filled 2021-01-07 (×3): qty 1

## 2021-01-07 MED ORDER — AMLODIPINE BESYLATE 10 MG PO TABS
10.0000 mg | ORAL_TABLET | Freq: Every day | ORAL | Status: DC
  Administered 2021-01-07 – 2021-01-08 (×2): 10 mg via ORAL
  Filled 2021-01-07 (×2): qty 1

## 2021-01-07 MED ORDER — TRAZODONE HCL 50 MG PO TABS: 25.0000 mg | ORAL_TABLET | Freq: Every evening | ORAL | Status: DC | PRN

## 2021-01-07 MED ORDER — ACETAMINOPHEN 650 MG RE SUPP: 650.0000 mg | Freq: Four times a day (QID) | RECTAL | Status: DC | PRN

## 2021-01-07 MED ORDER — IPRATROPIUM-ALBUTEROL 0.5-2.5 (3) MG/3ML IN SOLN: 3.0000 mL | Freq: Four times a day (QID) | RESPIRATORY_TRACT | Status: DC

## 2021-01-07 MED ORDER — SODIUM CHLORIDE 0.9 % IV SOLN: INTRAVENOUS | Status: DC

## 2021-01-07 MED ORDER — ALBUTEROL SULFATE HFA 108 (90 BASE) MCG/ACT IN AERS
2.0000 | INHALATION_SPRAY | RESPIRATORY_TRACT | Status: DC | PRN
  Administered 2021-01-08: 2 via RESPIRATORY_TRACT
  Filled 2021-01-07: qty 6.7

## 2021-01-07 MED ORDER — MONTELUKAST SODIUM 10 MG PO TABS
10.0000 mg | ORAL_TABLET | Freq: Every day | ORAL | Status: DC
  Administered 2021-01-07: 10 mg via ORAL
  Filled 2021-01-07: qty 1

## 2021-01-07 MED ORDER — BENZONATATE 100 MG PO CAPS
200.0000 mg | ORAL_CAPSULE | Freq: Three times a day (TID) | ORAL | Status: DC
  Administered 2021-01-07 – 2021-01-08 (×3): 200 mg via ORAL
  Filled 2021-01-07 (×3): qty 2

## 2021-01-07 MED ORDER — ALBUTEROL SULFATE (2.5 MG/3ML) 0.083% IN NEBU
5.0000 mg | INHALATION_SOLUTION | RESPIRATORY_TRACT | Status: AC
  Administered 2021-01-07 (×2): 5 mg via RESPIRATORY_TRACT
  Filled 2021-01-07 (×2): qty 6

## 2021-01-07 MED ORDER — IPRATROPIUM BROMIDE 0.02 % IN SOLN
0.5000 mg | RESPIRATORY_TRACT | Status: AC
  Administered 2021-01-07 (×2): 0.5 mg via RESPIRATORY_TRACT
  Filled 2021-01-07 (×2): qty 2.5

## 2021-01-07 MED ORDER — ONDANSETRON HCL 4 MG PO TABS: 4.0000 mg | ORAL_TABLET | Freq: Four times a day (QID) | ORAL | Status: DC | PRN

## 2021-01-07 MED ORDER — PROPRANOLOL HCL 40 MG PO TABS
40.0000 mg | ORAL_TABLET | Freq: Two times a day (BID) | ORAL | Status: DC
  Administered 2021-01-07 – 2021-01-08 (×3): 40 mg via ORAL
  Filled 2021-01-07 (×4): qty 1

## 2021-01-07 MED ORDER — IPRATROPIUM-ALBUTEROL 0.5-2.5 (3) MG/3ML IN SOLN: 3.0000 mL | RESPIRATORY_TRACT | Status: DC | PRN

## 2021-01-07 MED ORDER — LOSARTAN POTASSIUM 50 MG PO TABS
100.0000 mg | ORAL_TABLET | Freq: Every day | ORAL | Status: DC
  Administered 2021-01-07 – 2021-01-08 (×2): 100 mg via ORAL
  Filled 2021-01-07 (×2): qty 2

## 2021-01-07 MED ORDER — ONDANSETRON HCL 4 MG/2ML IJ SOLN: 4.0000 mg | Freq: Four times a day (QID) | INTRAMUSCULAR | Status: DC | PRN

## 2021-01-07 MED ORDER — LOSARTAN POTASSIUM-HCTZ 100-25 MG PO TABS: 1.0000 | ORAL_TABLET | Freq: Every day | ORAL | Status: DC

## 2021-01-07 MED ORDER — ACETAMINOPHEN 325 MG PO TABS: 650.0000 mg | ORAL_TABLET | Freq: Four times a day (QID) | ORAL | Status: DC | PRN

## 2021-01-07 MED ORDER — MAGNESIUM HYDROXIDE 400 MG/5ML PO SUSP: 30.0000 mL | Freq: Every day | ORAL | Status: DC | PRN

## 2021-01-07 MED ORDER — SODIUM CHLORIDE 0.9 % IV SOLN
1.0000 g | INTRAVENOUS | Status: DC
  Administered 2021-01-07: 1 g via INTRAVENOUS
  Filled 2021-01-07: qty 10

## 2021-01-07 MED ORDER — ALLOPURINOL 100 MG PO TABS
300.0000 mg | ORAL_TABLET | Freq: Every day | ORAL | Status: DC
  Administered 2021-01-07 – 2021-01-08 (×2): 300 mg via ORAL
  Filled 2021-01-07 (×2): qty 3

## 2021-01-07 MED ORDER — HYDROCHLOROTHIAZIDE 25 MG PO TABS
25.0000 mg | ORAL_TABLET | Freq: Every day | ORAL | Status: DC
  Administered 2021-01-07 – 2021-01-08 (×2): 25 mg via ORAL
  Filled 2021-01-07 (×2): qty 1

## 2021-01-07 MED ORDER — BENZONATATE 100 MG PO CAPS
100.0000 mg | ORAL_CAPSULE | Freq: Once | ORAL | Status: AC
  Administered 2021-01-07: 100 mg via ORAL
  Filled 2021-01-07: qty 1

## 2021-01-07 MED ORDER — HYDRALAZINE HCL 20 MG/ML IJ SOLN: 10.0000 mg | Freq: Four times a day (QID) | INTRAMUSCULAR | Status: DC | PRN

## 2021-01-07 MED ORDER — FINASTERIDE 5 MG PO TABS
5.0000 mg | ORAL_TABLET | Freq: Every day | ORAL | Status: DC
  Administered 2021-01-07 – 2021-01-08 (×2): 5 mg via ORAL
  Filled 2021-01-07 (×2): qty 1

## 2021-01-07 MED ORDER — METHYLPREDNISOLONE SODIUM SUCC 40 MG IJ SOLR
40.0000 mg | Freq: Two times a day (BID) | INTRAMUSCULAR | Status: DC
  Administered 2021-01-07 – 2021-01-08 (×3): 40 mg via INTRAVENOUS
  Filled 2021-01-07 (×3): qty 1

## 2021-01-07 MED ORDER — SIMVASTATIN 20 MG PO TABS
20.0000 mg | ORAL_TABLET | Freq: Every day | ORAL | Status: DC
  Administered 2021-01-07: 20 mg via ORAL
  Filled 2021-01-07 (×2): qty 1

## 2021-01-07 MED ORDER — ESCITALOPRAM OXALATE 10 MG PO TABS
10.0000 mg | ORAL_TABLET | Freq: Every day | ORAL | Status: DC
  Administered 2021-01-07 – 2021-01-08 (×2): 10 mg via ORAL
  Filled 2021-01-07 (×2): qty 1

## 2021-01-07 MED ORDER — METHYLPREDNISOLONE SODIUM SUCC 125 MG IJ SOLR
125.0000 mg | Freq: Once | INTRAMUSCULAR | Status: AC
  Administered 2021-01-07: 125 mg via INTRAVENOUS
  Filled 2021-01-07: qty 2

## 2021-01-07 NOTE — H&P (Signed)
Roy Nelson   PATIENT NAME: Roy Nelson    MR#:  [id number]  DATE OF BIRTH:  07-07-1946  DATE OF ADMISSION:  01/07/2021  PRIMARY CARE PHYSICIAN: Derinda Late, MD   Patient is coming from: Home  REQUESTING/REFERRING PHYSICIAN: Ward, Cyril Mourning, DO CHIEF COMPLAINT:   Chief Complaint  Patient presents with  . Respiratory Distress    HISTORY OF PRESENT ILLNESS:  Roy Nelson is a 76 y.o. Caucasian male with medical history significant for COPD and asthma as well as hypertension, who presented to the emergency room cute onset of worsening dyspnea today that started on Friday with associated cough over the last couple of weeks and wheezing.  He denies any fever or chills.  No chest pain or palpitations.  No nausea or vomiting or abdominal pain.  No dysuria, oliguria or hematuria or flank pain.    ED Course: Upon presenting to the ER blood pressure was 196/83 with a respiratory to 27 with otherwise normal vital signs.  Pulse ox 90 has been in the low 90s initially for treatment.  Labs revealed unremarkable BMP and leukocytosis of 12.3 on CBC with neutrophilia.  High-sensitivity troponin was 1551 and BNP was 83.7.  Influenza antigens and COVID-19 PCR came back negative.  EKG as reviewed by me : Showed normal sinus rhythm with a rate of 70 low voltage QRS. Imaging: Portable chest x-ray showed no acute cardiopulmonary disease.  The patient was given nebulizer albuterol and Atrovent for 3 treatments, 125 mg IV Solu-Medrol and Tessalon 100 mg.  He was still having significant wheezing and tightness.  He will be admitted to a medical monitored bed for further evaluation and management PAST MEDICAL HISTORY:   Past Medical History:  Diagnosis Date  . Asthma   . COPD (chronic obstructive pulmonary disease) ([place])   . Hypertension     PAST SURGICAL HISTORY:   Past Surgical History:  Procedure Laterality Date  . TONSILLECTOMY      SOCIAL HISTORY:   Social History   Tobacco  Use  . Smoking status: Never Smoker  . Smokeless tobacco: Never Used  Substance Use Topics  . Alcohol use: Yes    FAMILY HISTORY:  Positive for MI in his grandfather.  DRUG ALLERGIES:   Allergies  Allergen Reactions  . Penicillin G Benzathine     Other reaction(s): Other (See Comments), Unknown    REVIEW OF SYSTEMS:   ROS As per history of present illness. All pertinent systems were reviewed above. Constitutional, HEENT, cardiovascular, respiratory, GI, GU, musculoskeletal, neuro, psychiatric, endocrine, integumentary and hematologic systems were reviewed and are otherwise negative/unremarkable except for positive findings mentioned above in the HPI.   MEDICATIONS AT HOME:   Prior to Admission medications   Medication Sig Start Date End Date Taking? Authorizing Provider  losartan-hydrochlorothiazide (HYZAAR) 100-25 MG tablet Take 1 tablet by mouth daily. 10/15/17   [provider]      VITAL SIGNS:  Blood pressure (!) 164/66, pulse 79, temperature 98.8 F (37.1 C), resp. rate 19, height 5\' 11"  (1.803 m), weight 98 kg, SpO2 93 %.  PHYSICAL EXAMINATION:  Physical Exam  GENERAL:  75 y.o.-year-old Caucasian male patient lying in the bed with conversational dyspnea. EYES: Pupils equal, round, reactive to light and accommodation. No scleral icterus. Extraocular muscles intact.  HEENT: Head atraumatic, normocephalic. Oropharynx and nasopharynx clear.  NECK:  Supple, no jugular venous distention. No thyroid enlargement, no tenderness.  LUNGS: Diffuse expiratory wheezes with tight expiratory airflow and  heart regular breathing. CARDIOVASCULAR: Regular rate and rhythm, S1, S2 normal. No murmurs, rubs, or gallops.  ABDOMEN: Soft, nondistended, nontender. Bowel sounds present. No organomegaly or mass.  EXTREMITIES: No pedal edema, cyanosis, or clubbing.  NEUROLOGIC: Cranial nerves II through XII are intact. Muscle strength 5/5 in all extremities. Sensation intact. Gait not  checked.  PSYCHIATRIC: The patient is alert and oriented x 3.  Normal affect and good eye contact. SKIN: No obvious rash, lesion, or ulcer.   LABORATORY PANEL:   CBC Recent Labs  Lab 01/07/21 0126  WBC 12.3*  HGB 16.4  HCT 48.6  PLT 168   ------------------------------------------------------------------------------------------------------------------  Chemistries  Recent Labs  Lab 01/07/21 0126  NA 138  K 3.8  CL 101  CO2 28  GLUCOSE 124*  BUN 16  CREATININE 1.05  CALCIUM 9.1   ------------------------------------------------------------------------------------------------------------------  Cardiac Enzymes No results for input(s): TROPONINI in the last 168 hours. ------------------------------------------------------------------------------------------------------------------  RADIOLOGY:  DG Chest Portable 1 View  Result Date: 01/07/2021 CLINICAL DATA:  Shortness of breath. EXAM: PORTABLE CHEST 1 VIEW COMPARISON:  Chest x-ray 09/25/2014 FINDINGS: The heart size and mediastinal contours are within normal limits. No focal consolidation. No pulmonary edema. No pleural effusion. No pneumothorax. No acute osseous abnormality. Bilateral acromioclavicular joint degenerative changes. IMPRESSION: No active disease. Electronically Signed   By: Iven Finn M.D.   On: 01/07/2021 01:58      IMPRESSION AND PLAN:  Active Problems:   COPD exacerbation ([place])  1.  COPD and asthma acute exacerbation likely secondary to acute bronchitis. - The patient will be admitted to a medically monitored bed. - Continue nebulized bronchodilator therapy with DuoNebs 4 times daily and every 4 hours as needed. - Mucolytic therapy will be provided. - We will add antibiotic therapy with IV Rocephin. - We will check sputum and blood cultures. - We will hold off Trelegy. - We will continue Singulair  2.  Hypertensive urgency. - The patient will be placed on IV labetalol and continue his  antihypertensives.  3.  Depression. - We will continue Lexapro.  4.  BPH. - We will continue Proscar.  5.  Dyslipidemia. - Continue statin therapy.  6.  GERD - PPI therapy will be resumed.  DVT prophylaxis: Lovenox. Code Status: Patient is DNR/DNI.  This was discussed with him Family Communication:  The plan of care was discussed in details with the patient (and family). I answered all questions. The patient agreed to proceed with the above mentioned plan. Further management will depend upon hospital course. Disposition Plan: Back to previous home environment Consults called: none.  All the records are reviewed and case discussed with ED provider.  Status is: Inpatient  Remains inpatient appropriate because:Ongoing diagnostic testing needed not appropriate for outpatient work up, Unsafe d/c plan, IV treatments appropriate due to intensity of illness or inability to take PO and Inpatient level of care appropriate due to severity of illness   Dispo: The patient is from: Home              Anticipated d/c is to: Home              Patient currently is not medically stable to d/c.   Difficult to place patient No  TOTAL TIME TAKING CARE OF THIS PATIENT: 55 minutes.    Christel Mormon M.D on 01/07/2021 at 4:50 AM  [facility]   From 7 PM-7 AM, contact night-coverage [email or web address]  CC: Primary care physician; Derinda Late, MD

## 2021-01-07 NOTE — ED Provider Notes (Signed)
[facility] Emergency Department Provider Note  ____________________________________________   Event Date/Time   First MD Initiated Contact with Patient 01/07/21 0126     (approximate)  I have reviewed the triage vital signs and the nursing notes.   HISTORY  Chief Complaint Respiratory Distress    HPI Roy Nelson is a 75 y.o. male with history of hypertension, asthma, COPD not on home oxygen who presents to the emergency department EMS with progressively worsening shortness of breath that has been ongoing for for 3 days.  States he has had nonproductive cough for 2 weeks.  States he thinks he may have had a fever today but did not measure it.  No chest pain or chest discomfort.  No lower extremity swelling or pain.  No history of CAD, CHF, PE or DVT.  Has been vaccinated against influenza but not for COVID-19.        Past Medical History:  Diagnosis Date  . Asthma   . COPD (chronic obstructive pulmonary disease) ([place])   . Hypertension     There are no problems to display for this patient.   Past Surgical History:  Procedure Laterality Date  . TONSILLECTOMY      Prior to Admission medications   Medication Sig Start Date End Date Taking? Authorizing Provider  losartan-hydrochlorothiazide (HYZAAR) 100-25 MG tablet Take 1 tablet by mouth daily. 10/15/17   [provider]    Allergies Penicillin g benzathine  History reviewed. No pertinent family history.  Social History Social History   Tobacco Use  . Smoking status: Never Smoker  . Smokeless tobacco: Never Used  Vaping Use  . Vaping Use: Never used  Substance Use Topics  . Alcohol use: Yes    Review of Systems Constitutional: No fever. Eyes: No visual changes. ENT: No sore throat. Cardiovascular: Denies chest pain. Respiratory: Denies shortness of breath. Gastrointestinal: No nausea, vomiting, diarrhea. Genitourinary: Negative for dysuria. Musculoskeletal: Negative  for back pain. Skin: Negative for rash. Neurological: Negative for focal weakness or numbness.  ____________________________________________   PHYSICAL EXAM:  VITAL SIGNS: ED Triage Vitals [01/07/21 0124]  Enc Vitals Group     BP (!) 196/83     Pulse Rate 79     Resp (!) 27     Temp 98.8 F (37.1 C)     Temp src      SpO2 94 %     Weight      Height      Head Circumference      Peak Flow      Pain Score      Pain Loc      Pain Edu?      Excl. in [place]?    CONSTITUTIONAL: Alert and oriented and responds appropriately to questions.  Elderly, in no respiratory distress HEAD: Normocephalic EYES: Conjunctivae clear, pupils appear equal, EOM appear intact ENT: normal nose; moist mucous membranes NECK: Supple, normal ROM CARD: RRR; S1 and S2 appreciated; no murmurs, no clicks, no rubs, no gallops RESP: Patient is tachypneic.  He is speaking short sentences and appears short of breath but not in distress.  He has diffuse inspiratory and expiratory wheezes on exam.  No rhonchi or rales. ABD/GI: Normal bowel sounds; non-distended; soft, non-tender, no rebound, no guarding, no peritoneal signs, no hepatosplenomegaly BACK: The back appears normal EXT: Normal ROM in all joints; no deformity noted, no edema; no cyanosis, no calf tenderness or calf swelling SKIN: Normal color for age and race; warm;  no rash on exposed skin NEURO: Moves all extremities equally PSYCH: The patient's mood and manner are appropriate.  ____________________________________________   LABS (all labs ordered are listed, but only abnormal results are displayed)  Labs Reviewed  CBC WITH DIFFERENTIAL/PLATELET - Abnormal; Notable for the following components:      Result Value   WBC 12.3 (*)    Monocytes Absolute 1.8 (*)    Eosinophils Absolute 1.2 (*)    All other components within normal limits  BASIC METABOLIC PANEL - Abnormal; Notable for the following components:   Glucose, Bld 124 (*)    All other  components within normal limits  TROPONIN I (HIGH SENSITIVITY) - Abnormal; Notable for the following components:   Troponin I (High Sensitivity) 50 (*)    All other components within normal limits  TROPONIN I (HIGH SENSITIVITY) - Abnormal; Notable for the following components:   Troponin I (High Sensitivity) 51 (*)    All other components within normal limits  RESP PANEL BY RT-PCR (FLU A&B, COVID) ARPGX2  BRAIN NATRIURETIC PEPTIDE   ____________________________________________  EKG   EKG Interpretation  Date/Time:  Monday Jan 07 2021 01:27:58 EDT Ventricular Rate:  77 PR Interval:  217 QRS Duration: 81 QT Interval:  376 QTC Calculation: 426 R Axis:   10 Text Interpretation: Sinus rhythm Borderline prolonged PR interval Low voltage, precordial leads Confirmed by Pryor Curia [phone]) on 01/07/2021 1:31:28 AM       ____________________________________________  RADIOLOGY Jessie Foot Divonte Senger, personally viewed and evaluated these images (plain radiographs) as part of my medical decision making, as well as reviewing the written report by the radiologist.  ED MD interpretation: Chest x-ray clear.  Official radiology report(s): DG Chest Portable 1 View  Result Date: 01/07/2021 CLINICAL DATA:  Shortness of breath. EXAM: PORTABLE CHEST 1 VIEW COMPARISON:  Chest x-ray 09/25/2014 FINDINGS: The heart size and mediastinal contours are within normal limits. No focal consolidation. No pulmonary edema. No pleural effusion. No pneumothorax. No acute osseous abnormality. Bilateral acromioclavicular joint degenerative changes. IMPRESSION: No active disease. Electronically Signed   By: Iven Finn M.D.   On: 01/07/2021 01:58    ____________________________________________   PROCEDURES  Procedure(s) performed (including Critical Care):  Procedures    ____________________________________________   INITIAL IMPRESSION / ASSESSMENT AND PLAN / ED COURSE  As part of my medical decision  making, I reviewed the following data within the [place] History obtained from family, Nursing notes reviewed and incorporated, Labs reviewed , EKG interpreted , Old EKG reviewed, Old chart reviewed, Radiograph reviewed , Discussed with admitting physician  and Notes from prior ED visits         Patient here with shortness of breath.  Differential includes COPD, CHF, pneumonia, COVID-19, influenza, PE, pneumothorax.  He is not hypoxic on room air.  Will give albuterol, Atrovent, Solu-Medrol for symptomatic relief.  EKG is nonischemic.  Labs, chest x-ray pending.  Patient may require admission.  ED PROGRESS  Patient's labs show minimal leukocytosis.  Troponins are slightly elevated but flat.  BNP normal.  Chest x-ray is clear.  Patient reports feeling better after 3 albuterol and 3 Atrovent treatments and 125 of IV Solu-Medrol but is still wheezing with slight increased work of breathing.  Sats 92% on room air.  Have recommended admission for COPD exacerbation and he agrees.  Will discuss with hospitalist.  4:30 AM Discussed patient's case with hospitalist, Dr. Sidney Ace.  I have recommended admission and patient (and family if present) agree with  this plan. Admitting physician will place admission orders.   I reviewed all nursing notes, vitals, pertinent previous records and reviewed/interpreted all EKGs, lab and urine results, imaging (as available).   ____________________________________________   FINAL CLINICAL IMPRESSION(S) / ED DIAGNOSES  Final diagnoses:  COPD exacerbation [facility])     ED Discharge Orders    None      *Please note:  Roy Nelson was evaluated in Emergency Department on 01/07/2021 for the symptoms described in the history of present illness. He was evaluated in the context of the global COVID-19 pandemic, which necessitated consideration that the patient might be at risk for infection with the SARS-CoV-2 virus that causes COVID-19. Institutional  protocols and algorithms that pertain to the evaluation of patients at risk for COVID-19 are in a state of rapid change based on information released by regulatory bodies including the CDC and federal and state organizations. These policies and algorithms were followed during the patient's care in the ED.  Some ED evaluations and interventions may be delayed as a result of limited staffing during and the pandemic.*   Note:  This document was prepared using Dragon voice recognition software and may include unintentional dictation errors.   Gabrielly Mccrystal, Delice Bison, DO 01/07/21 [phone]

## 2021-01-07 NOTE — Progress Notes (Signed)
PHARMACIST - PHYSICIAN COMMUNICATION  CONCERNING:  Enoxaparin (Lovenox) for DVT Prophylaxis    RECOMMENDATION: Patient was prescribed enoxaprin 40mg  q24 hours for VTE prophylaxis.   Filed Weights   01/07/21 0147  Weight: 98 kg (216 lb)    Body mass index is 30.13 kg/m.  Estimated Creatinine Clearance: 74.8 mL/min (by C-G formula based on SCr of 1.05 mg/dL).   Based on [facility] patient is candidate for enoxaparin 0.5mg /kg TBW SQ every 24 hours based on BMI being >30.  DESCRIPTION: Pharmacy has adjusted enoxaparin dose per [facility] policy.  Patient is now receiving enoxaparin 0.5 mg/kg every 24 hours   Renda Rolls, PharmD, [facility] 01/07/2021 5:03 AM

## 2021-01-07 NOTE — Progress Notes (Signed)
Nutrition Brief Note  Patient identified on the Malnutrition Screening Tool (MST) Report  Wt Readings from Last 15 Encounters:  01/07/21 98 kg  04/27/18 97.5 kg   Pt resting in bed at the time of visit, family at bedside. Pt reports good appetite now and PTA. States that SOB has not affected his ability to take in POs. Discussed reported weight loss. Pt states that he has been more active recently and working outside which he believes is the reason for weight loss. Weight loss is not severe.   Body mass index is 30.13 kg/m. Patient meets criteria for obesity class 1 based on current BMI.   Current diet order is heart healthy, patient is consuming approximately 90% of meals at this time. Labs and medications reviewed.   No nutrition interventions warranted at this time. If nutrition issues arise, please consult RD.   Ranell Patrick, RD, LDN Clinical Dietitian Pager on [place]

## 2021-01-07 NOTE — Progress Notes (Signed)
*  PRELIMINARY RESULTS* Echocardiogram 2D Echocardiogram has been performed.  Roy Nelson 01/07/2021, 1:55 PM

## 2021-01-07 NOTE — ED Triage Notes (Addendum)
Pt presents via EMS from home for c/o SOB x2 days and worse tonight while lying down for bed. Per report, the pt has had a cough x 2 weeks and was initially 92% on RA. Pt reports using his inhaler today and used his home nebulizer just prior to EMS arrival.  Pt with audible wheezing on arrival.

## 2021-01-07 NOTE — Progress Notes (Signed)
PROGRESS NOTE  Roy Nelson  DOB: [birth year]  PCP: Derinda Late, MD [phone]  DOA: 01/07/2021  LOS: 0 days  Hospital Day: 1   Chief Complaint  Patient presents with  . Respiratory Distress    Brief narrative: Roy Nelson is a 75 y.o. male with PMH significant for COPD, asthma, HTN.   Patient presented to the ED on 5/23 with progressive dyspnea for 2 weeks associated with cough and wheezing, acutely worse in 2 days.   In the ER, patient had a blood pressure elevated to 196/83, tachypneic in 20s, with wheezing that improved with IV Solu-Medrol and breathing treatment.   Blood work unremarkable except for WBC count mildly elevated 12.3, troponin elevated to 50. COVID PCR influenza antigen negative. EKG showed normal sinus rhythm at the rate of 70 with low voltage QRS Portable chest x-ray showed no acute cardiopulmonary disease. Admitted to hospital service  Subjective: Patient was seen and examined this morning.  Is an elderly Caucasian male.  Lying on bed.  On 3 L oxygen by nasal cannula.  Daughter at bedside. Does not smoke.  Worked in [employer] in the past.  Has COPD on inhalers at home.  Not on supplemental oxygen. Chart reviewed.  Blood pressure remains elevated as high as 184/99 this morning Reviewed blood work this morning with troponin stable at 51, lab work unremarkable otherwise.  Assessment/Plan: Acute exacerbation of COPD -Probably due to viral versus atypical bacterial bronchitis -no evidence of pneumonia and chest x-ray. -Clinically he had wheezing which is improving with IV steroids, bronchodilators. -Home meds include DuoNeb, Singulair 10 mg daily, prednisone 5 mg every other day, -I will start him on Solu-Medrol 40 mg IV twice daily.  Continue bronchodilators, Mucinex. -Currently on IV Rocephin.  Normal procalcitonin level.  We will stop IV Rocephin. Recent Labs  Lab 01/07/21 0126 01/07/21 0545 01/07/21 0626  WBC 12.3* 9.9  --   PROCALCITON   --   --  <0.10   Hypertensive urgency -Blood pressure was elevated up to 190s on presentation.  Remained elevated mostly 160s overnight. -Home meds include Propranolol 40 mg daily, amlodipine 10 mg daily, HCTZ 25 mg daily, losartan 100 mg daily. -Resumed all. -As needed IV hydralazine. -Obtain echocardiogram.  Depression. -Continue Lexapro.  BPH. -Continue Flomax and Proscar.  Dyslipidemia. -Continue simvastatin continue statin therapy.  Gout -Continue allopurinol 300 mg daily   Mobility: Independent at baseline.  Encourage ambulation Code Status:   Code Status: Full Code  Nutritional status: Body mass index is 30.13 kg/m.     Diet Order            Diet Heart Room service appropriate? Yes; Fluid consistency: Thin  Diet effective now                 DVT prophylaxis: Lovenox subcu   Antimicrobials:  Stop antibiotics Fluid: Stop IV fluid Consultants: None Family Communication:  Daughter at bedside  Status is: Inpatient  Remains inpatient appropriate because: Still have some wheezing, needs IV steroids  Dispo: The patient is from: Home              Anticipated d/c is to: Home in 1 to 2 days              Patient currently is not medically stable to d/c.   Difficult to place patient No     Infusions:    Scheduled Meds: . benzonatate  200 mg Oral TID  . enoxaparin (LOVENOX) injection  0.5 mg/kg Subcutaneous Q24H  . guaiFENesin  600 mg Oral BID  . losartan  100 mg Oral Daily   And  . hydrochlorothiazide  25 mg Oral Daily  . ipratropium-albuterol  3 mL Nebulization TID  . methylPREDNISolone (SOLU-MEDROL) injection  40 mg Intravenous Q12H    Antimicrobials: Anti-infectives (From admission, onward)   Start     Dose/Rate Route Frequency Ordered Stop   01/07/21 0500  cefTRIAXone (ROCEPHIN) 1 g in sodium chloride 0.9 % 100 mL IVPB  Status:  Discontinued        1 g 200 mL/hr over 30 Minutes Intravenous Every 24 hours 01/07/21 0500 01/07/21 1131       PRN meds: acetaminophen **OR** acetaminophen, albuterol, dextromethorphan-guaiFENesin, hydrALAZINE, ipratropium-albuterol **AND** ipratropium-albuterol, magnesium hydroxide, ondansetron **OR** ondansetron (ZOFRAN) IV, traZODone   Objective: Vitals:   01/07/21 0857 01/07/21 0918  BP: (!) 160/84 (!) 187/90  Pulse: 84 75  Resp: 16 (!) 24  Temp:  98.2 F (36.8 C)  SpO2:  92%   No intake or output data in the 24 hours ending 01/07/21 1207 Filed Weights   01/07/21 0147  Weight: 98 kg   Weight change:  Body mass index is 30.13 kg/m.   Physical Exam: General exam: Pleasant elderly Caucasian male.  Not in distress Skin: No rashes, lesions or ulcers. HEENT: Atraumatic, normocephalic, no obvious bleeding Lungs: Mild scattered bilateral wheezing.  On 3 L oxygen CVS: Regular rate and rhythm, no murmur GI/Abd soft, nontender, nondistended, bowel sound present CNS: Alert, awake, oriented x3 Psychiatry: Mood appropriate Extremities: No pedal edema, no calf tenderness  Data Review: I have personally reviewed the laboratory data and studies available.  Recent Labs  Lab 01/07/21 0126 01/07/21 0545  WBC 12.3* 9.9  NEUTROABS 7.7  --   HGB 16.4 16.3  HCT 48.6 46.7  MCV 94.7 93.4  PLT 168 165   Recent Labs  Lab 01/07/21 0126 01/07/21 0626  NA 138 136  K 3.8 3.8  CL 101 102  CO2 28 24  GLUCOSE 124* 158*  BUN 16 17  CREATININE 1.05 1.02  CALCIUM 9.1 8.8*    F/u labs ordered Unresulted Labs (From admission, onward)          Start     Ordered   01/07/21 1207  Hemoglobin A1c  Add-on,   AD        01/07/21 1207   01/07/21 0459  Expectorated sputum assessment w rflx to resp cult  (COPD / Pneumonia )  Once,   STAT        01/07/21 0500          Signed, Terrilee Croak, MD [facility] 01/07/2021

## 2021-01-07 NOTE — ED Notes (Signed)
Drew second troponin and sent to lab at this time.

## 2021-01-07 NOTE — Plan of Care (Signed)
Patient aware we need  To collect sputum sample, but isn't having productive cough currently.  Will continue to assess.

## 2021-01-07 NOTE — ED Notes (Signed)
This RN called in the room pt began having a episode of coughing and could not catch his breath. Pt O2 saturation decreased to 88% on RA. Pt placed on 2L , pt sat 94%.

## 2021-01-08 DIAGNOSIS — J441 Chronic obstructive pulmonary disease with (acute) exacerbation: Secondary | ICD-10-CM | POA: Diagnosis not present

## 2021-01-08 DIAGNOSIS — J449 Chronic obstructive pulmonary disease, unspecified: Secondary | ICD-10-CM | POA: Diagnosis present

## 2021-01-08 LAB — EXPECTORATED SPUTUM ASSESSMENT W GRAM STAIN, RFLX TO RESP C

## 2021-01-08 LAB — ECHOCARDIOGRAM COMPLETE BUBBLE STUDY
AR max vel: 2.39 cm2
AV Area VTI: 2.58 cm2
AV Area mean vel: 2.29 cm2
AV Mean grad: 5.5 mmHg
AV Peak grad: 9.6 mmHg
Ao pk vel: 1.55 m/s
Area-P 1/2: 2.96 cm2
S' Lateral: 2.09 cm

## 2021-01-08 MED ORDER — PREDNISONE 10 MG PO TABS: ORAL_TABLET | ORAL | 0 refills | Status: AC

## 2021-01-08 MED ORDER — GUAIFENESIN-DM 100-10 MG/5ML PO SYRP: 5.0000 mL | ORAL_SOLUTION | ORAL | 0 refills | Status: AC | PRN

## 2021-01-08 MED ORDER — PREDNISONE 5 MG PO TABS: 5.0000 mg | ORAL_TABLET | ORAL | Status: AC

## 2021-01-08 NOTE — Care Management (Signed)
Patient with qualifying sats for home O2 Referral made to [facility] with [facility].  O2 to be delivered to room prior to discharge Per MD and Bedside RN no further TOC needs for discharge

## 2021-01-08 NOTE — Progress Notes (Signed)
Per NP Randol Kern verbal order, ok to discontinue cardiac monitoring order and continuous pulse ox. Patient's Oz Sats at midnight 100% and 6am 92% on 2 L [place].

## 2021-01-08 NOTE — Plan of Care (Signed)
Discharge instructions reviewed with patient who verbalized understanding.  Oxygen for home delivered in room and demonstrated use.  Piv d/c prior to discharge.  Family escorted patient to home

## 2021-01-08 NOTE — Care Management Obs Status (Signed)
[facility] [place] NOTIFICATION   Patient Details  Name: ROSE HIPPLER MRN: [id number] Date of Birth: Feb 26, 1946   Medicare Observation Status Notification Given:  Yes    Beverly Sessions, RN 01/08/2021, 2:43 PM

## 2021-01-08 NOTE — Progress Notes (Signed)
Mobility Specialist - Progress Note   01/08/21 1211  Mobility  Activity Ambulated in hall  Level of Assistance Standby assist, set-up cues, supervision of patient - no hands on  Assistive Device None  Distance Ambulated (ft) 90 ft  Mobility Ambulated with assistance in hallway  Mobility Response Tolerated well  Mobility performed by Mobility specialist  $Mobility charge 1 Mobility    O2 while resting on RA = 89% O2 while AMB on RA = 84% O2 while AMB on 2L = 92%   Pt EOB on arrival utilizing RA, O2 sitting at 89%. PLB educated and engaged throughout session. O2 sats monitored and recorded above. Denied dizziness upon standing. Pt educated on incremental movement which was applied during session. 1 extensive seated rest break taken for breathing management. Labored breathing noted. Pt encouraged to implement more breaks in between bouts of activity to prevent distress, pt showed understanding. HR 60-70 bpm. Pt left EOB, sats at 92% while on 2L prior to exit. Family at bedside.    Kathee Delton Mobility Specialist 01/08/21, 12:25 PM

## 2021-01-08 NOTE — Discharge Summary (Signed)
Physician Discharge Summary  Roy Nelson GXQ:[id number] DOB: 30-Apr-1946 DOA: 01/07/2021  PCP: Roy Late, MD  Admit date: 01/07/2021 Discharge date: 01/08/2021  Admitted From: Home Discharge disposition: Home with home oxygen   Code Status: Full Code  Diet Recommendation: Cardiac diet  Discharge Diagnosis:   Active Problems:   COPD exacerbation [facility])  Chief Complaint  Patient presents with  . Respiratory Distress    Brief narrative: Roy Nelson is a 75 y.o. male with PMH significant for COPD, asthma, HTN.   Patient presented to the ED on 5/23 with progressive dyspnea for 2 weeks associated with cough and wheezing, acutely worse in 2 days.   In the ER, patient had a blood pressure elevated to 196/83, tachypneic in 20s, with wheezing that improved with IV Solu-Medrol and breathing treatment.   Blood work unremarkable except for WBC count mildly elevated 12.3, troponin elevated to 50. COVID PCR influenza antigen negative. EKG showed normal sinus rhythm at the rate of 70 with low voltage QRS Portable chest x-ray showed no acute cardiopulmonary disease. Admitted to hospital service  Subjective: Patient was seen and examined this morning.  Lying on bed.  Not in distress.  Feels better.  Less wheezing.  On supplemental oxygen.   Require 2 L oxygen on ambulation to maintain saturation more than 90%.    Assessment/Plan: Acute exacerbation of COPD -Probably due to viral versus atypical bacterial bronchitis -no evidence of pneumonia and chest x-ray. -Clinically he had wheezing which is improving with IV steroids, bronchodilators. -Home meds include DuoNeb, Singulair 10 mg daily, prednisone 5 mg every other day, -Currently patient is on Solu-Medrol 40 mg IV twice daily.   -We will discharge him home on prednisone tapering course after which he will resume prednisone 5 mg every other day like before.  Continue bronchodilators, Mucinex. -No need of antibiotics. Recent Labs   Lab 01/07/21 0126 01/07/21 0545 01/07/21 0626  WBC 12.3* 9.9  --   PROCALCITON  --   --  <0.10   Hypertensive urgency -Blood pressure was elevated up to 190s on presentation.  Remained elevated mostly 160s overnight. -Home meds include Propranolol 40 mg daily, amlodipine 10 mg daily, HCTZ 25 mg daily, losartan 100 mg daily. -Continue all.  Echocardiogram normal.  Prediabetes -A1c 6.2 -Recommend dietary control. Lab Results  Component Value Date   HGBA1C 6.2 (H) 01/07/2021   Depression. -Continue Lexapro.  BPH. -Continue Flomax and Proscar.  Dyslipidemia. -Continue simvastatin continue statin therapy.  Gout -Continue allopurinol 300 mg daily  Stable discharge home today with home oxygen.   Wound care:    Discharge Exam:   Vitals:   01/07/21 2100 01/08/21 0625 01/08/21 0727 01/08/21 0915  BP: 133/73 (!) 160/80  (!) 152/74  Pulse: 87 63  69  Resp: (!) 22 18  15   Temp: 97.7 F (36.5 C) 98.1 F (36.7 C)  97.6 F (36.4 C)  TempSrc:  Oral  Oral  SpO2: 100% 92% 92% 93%  Weight:      Height:        Body mass index is 30.13 kg/m.  General exam: Pleasant, elderly Caucasian male.  Not in distress.  Feels better than yesterday Skin: No rashes, lesions or ulcers. HEENT: Atraumatic, normocephalic, no obvious bleeding Lungs: Mild end expiratory wheezing scattered, much better than yesterday CVS: Regular rate and rhythm, no murmur GI/Abd soft, nontender, nondistended, bowel sound present CNS: Alert, awake, oriented x3 Psychiatry: Mood appropriate Extremities: No pedal edema, no calf tenderness  Follow  ups:   Discharge Instructions    Diet - low sodium heart healthy   Complete by: As directed    Increase activity slowly   Complete by: As directed       Follow-up Information    Roy Late, MD Follow up.   Specialty: Family Medicine Contact information: [address] [facility] [place]  [address] [phone]               Recommendations for Outpatient Follow-Up:   1. Follow-up with PCP as an outpatient  Discharge Instructions:  Follow with Primary MD Roy Late, MD in 7 days   Get CBC/BMP checked in next visit within 1 week by PCP or SNF MD ( we routinely change or add medications that can affect your baseline labs and fluid status, therefore we recommend that you get the mentioned basic workup next visit with your PCP, your PCP may decide not to get them or add new tests based on their clinical decision)  On your next visit with your PCP, please Get Medicines reviewed and adjusted.  Please request your PCP  to go over all Hospital Tests and Procedure/Radiological results at the follow up, please get all Hospital records sent to your Prim MD by signing hospital release before you go home.  Activity: As tolerated with Full fall precautions use walker/cane & assistance as needed  For Heart failure patients - Check your Weight same time everyday, if you gain over 2 pounds, or you develop in leg swelling, experience more shortness of breath or chest pain, call your Primary MD immediately. Follow Cardiac Low Salt Diet and 1.5 lit/day fluid restriction.  If you have smoked or chewed Tobacco in the last 2 yrs please stop smoking, stop any regular Alcohol  and or any Recreational drug use.  If you experience worsening of your admission symptoms, develop shortness of breath, life threatening emergency, suicidal or homicidal thoughts you must seek medical attention immediately by calling 911 or calling your MD immediately  if symptoms less severe.  You Must read complete instructions/literature along with all the possible adverse reactions/side effects for all the Medicines you take and that have been prescribed to you. Take any new Medicines after you have completely understood and accpet all the possible adverse reactions/side effects.   Do not drive, operate heavy  machinery, perform activities at heights, swimming or participation in water activities or provide baby sitting services if your were admitted for syncope or siezures until you have seen by Primary MD or a Neurologist and advised to do so again.  Do not drive when taking Pain medications.  Do not take more than prescribed Pain, Sleep and Anxiety Medications  Wear Seat belts while driving.   Please note You were cared for by a hospitalist during your hospital stay. If you have any questions about your discharge medications or the care you received while you were in the hospital after you are discharged, you can call the unit and asked to speak with the hospitalist on call if the hospitalist that took care of you is not available. Once you are discharged, your primary care physician will handle any further medical issues. Please note that NO REFILLS for any discharge medications will be authorized once you are discharged, as it is imperative that you return to your primary care physician (or establish a relationship with a primary care physician if you do not have one) for your aftercare needs so that  they can reassess your need for medications and monitor your lab values.    Allergies as of 01/08/2021      Reactions   Penicillin G Benzathine    Other reaction(s): Other (See Comments), Unknown      Medication List    STOP taking these medications   naproxen 500 MG tablet Commonly known as: NAPROSYN     TAKE these medications   allopurinol 300 MG tablet Commonly known as: ZYLOPRIM Take 300 mg by mouth daily.   amLODipine 10 MG tablet Commonly known as: NORVASC Take 1 tablet by mouth daily.   escitalopram 10 MG tablet Commonly known as: LEXAPRO Take 1 tablet by mouth daily.   finasteride 5 MG tablet Commonly known as: PROSCAR Take 5 mg by mouth daily.   fluticasone 50 MCG/ACT nasal spray Commonly known as: FLONASE Place into both nostrils.   guaiFENesin-dextromethorphan  100-10 MG/5ML syrup Commonly known as: ROBITUSSIN DM Take 5 mLs by mouth every 4 (four) hours as needed for cough.   hydrochlorothiazide 25 MG tablet Commonly known as: HYDRODIURIL Take 1 tablet by mouth daily.   ipratropium-albuterol 0.5-2.5 (3) MG/3ML Soln Commonly known as: DUONEB SMARTSIG:1 Vial(s) Via Nebulizer 4 Times Daily PRN   losartan 100 MG tablet Commonly known as: COZAAR Take 1 tablet by mouth daily.   montelukast 10 MG tablet Commonly known as: SINGULAIR Take 1 tablet by mouth daily.   predniSONE 10 MG tablet Commonly known as: DELTASONE 4 tabs twice daily X 3 days -->4 tabs daily X 3 days-->3 tabs daily X 3 days--> 2 tabs daily X 3 days--> 1 tab daily X 3 days, then STOP. What changed: You were already taking a medication with the same name, and this prescription was added. Make sure you understand how and when to take each.   predniSONE 5 MG tablet Commonly known as: DELTASONE Take 1 tablet (5 mg total) by mouth every other day. Start taking on: January 23, 2021 What changed: These instructions start on January 23, 2021. If you are unsure what to do until then, ask your doctor or other care provider.   propranolol 40 MG tablet Commonly known as: INDERAL Take 40 mg by mouth 2 (two) times daily.   simvastatin 20 MG tablet Commonly known as: ZOCOR Take 20 mg by mouth at bedtime.   tamsulosin 0.4 MG Caps capsule Commonly known as: FLOMAX Take 0.4 mg by mouth daily.   Trelegy Ellipta 100-62.5-25 MCG/INH Aepb Generic drug: Fluticasone-Umeclidin-Vilant Inhale 1 puff into the lungs daily.            Durable Medical Equipment  (From admission, onward)         Start     Ordered   01/08/21 1330  For home use only DME oxygen  Once       Question Answer Comment  Length of Need Lifetime   Mode or (Route) Nasal cannula   Frequency Continuous (stationary and portable oxygen unit needed)   Oxygen conserving device Yes   Oxygen delivery system Gas      01/08/21  1329          Time coordinating discharge: 35 minutes  The results of significant diagnostics from this hospitalization (including imaging, microbiology, ancillary and laboratory) are listed below for reference.    Procedures and Diagnostic Studies:   DG Chest Portable 1 View  Result Date: 01/07/2021 CLINICAL DATA:  Shortness of breath. EXAM: PORTABLE CHEST 1 VIEW COMPARISON:  Chest x-ray 09/25/2014 FINDINGS: The heart size  and mediastinal contours are within normal limits. No focal consolidation. No pulmonary edema. No pleural effusion. No pneumothorax. No acute osseous abnormality. Bilateral acromioclavicular joint degenerative changes. IMPRESSION: No active disease. Electronically Signed   By: Iven Finn M.D.   On: 01/07/2021 01:58   ECHOCARDIOGRAM COMPLETE BUBBLE STUDY  Result Date: 01/08/2021    ECHOCARDIOGRAM REPORT   Patient Name:   Roy Nelson Date of Exam: 01/07/2021 Medical Rec #:  [id number]      Height:       71.0 in Accession #:    [id number]     Weight:       216.0 lb Date of Birth:  1945-10-11      BSA:          2.179 m Patient Age:    75 years       BP:           187/90 mmHg Patient Gender: M              HR:           75 bpm. Exam Location:  [facility] Procedure: 2D Echo, Cardiac Doppler, Color Doppler and Saline Contrast Bubble            Study Indications:     Murmur 785.2 /R01.1  History:         Patient has no prior history of Echocardiogram examinations.                  COPD; Risk Factors:Hypertension. ASthma.  Sonographer:     Sherrie Sport RDCS (AE) Referring Phys:  [id number] Terrilee Croak Diagnosing Phys: Serafina Royals MD  Sonographer Comments: No parasternal window and no subcostal window. Image acquisition challenging due to COPD. IMPRESSIONS  1. Left ventricular ejection fraction, by estimation, is 60 to 65%. The left ventricle has normal function. The left ventricle has no regional wall motion abnormalities. Left ventricular diastolic parameters were normal.  2. Right  ventricular systolic function is normal. The right ventricular size is normal.  3. The mitral valve is normal in structure. Trivial mitral valve regurgitation.  4. The aortic valve is normal in structure. Aortic valve regurgitation is not visualized.  5. Agitated saline contrast bubble study was negative, with no evidence of any interatrial shunt. FINDINGS  Left Ventricle: Left ventricular ejection fraction, by estimation, is 60 to 65%. The left ventricle has normal function. The left ventricle has no regional wall motion abnormalities. The left ventricular internal cavity size was normal in size. There is  no left ventricular hypertrophy. Left ventricular diastolic parameters were normal. Right Ventricle: The right ventricular size is normal. No increase in right ventricular wall thickness. Right ventricular systolic function is normal. Left Atrium: Left atrial size was normal in size. Right Atrium: Right atrial size was normal in size. Pericardium: There is no evidence of pericardial effusion. Mitral Valve: The mitral valve is normal in structure. Trivial mitral valve regurgitation. Tricuspid Valve: The tricuspid valve is normal in structure. Tricuspid valve regurgitation is mild. Aortic Valve: The aortic valve is normal in structure. Aortic valve regurgitation is not visualized. Aortic valve mean gradient measures 5.5 mmHg. Aortic valve peak gradient measures 9.6 mmHg. Aortic valve area, by VTI measures 2.58 cm. Pulmonic Valve: The pulmonic valve was normal in structure. Pulmonic valve regurgitation is not visualized. Aorta: The aortic root and ascending aorta are structurally normal, with no evidence of dilitation. IAS/Shunts: No atrial level shunt detected by color flow Doppler. Agitated saline contrast was given  intravenously to evaluate for intracardiac shunting. Agitated saline contrast bubble study was negative, with no evidence of any interatrial shunt.  LEFT VENTRICLE PLAX 2D LVIDd:         3.77 cm   Diastology LVIDs:         2.09 cm  LV e' medial:    4.35 cm/s LV PW:         1.30 cm  LV E/e' medial:  13.7 LV IVS:        1.27 cm  LV e' lateral:   5.22 cm/s LVOT diam:     2.00 cm  LV E/e' lateral: 11.4 LV SV:         58 LV SV Index:   27 LVOT Area:     3.14 cm  RIGHT VENTRICLE RV Basal diam:  3.67 cm RV S prime:     21.50 cm/s TAPSE (M-mode): 5.4 cm LEFT ATRIUM           Index       RIGHT ATRIUM           Index LA diam:      2.90 cm 1.33 cm/m  RA Area:     18.30 cm LA Vol (A2C): 23.3 ml 10.69 ml/m RA Volume:   46.80 ml  21.48 ml/m LA Vol (A4C): 56.4 ml 25.89 ml/m  AORTIC VALVE AV Area (Vmax):    2.39 cm AV Area (Vmean):   2.29 cm AV Area (VTI):     2.58 cm AV Vmax:           155.00 cm/s AV Vmean:          103.900 cm/s AV VTI:            0.226 m AV Peak Grad:      9.6 mmHg AV Mean Grad:      5.5 mmHg LVOT Vmax:         118.00 cm/s LVOT Vmean:        75.700 cm/s LVOT VTI:          0.185 m LVOT/AV VTI ratio: 0.82  AORTA Ao Root diam: 3.10 cm MITRAL VALVE                TRICUSPID VALVE MV Area (PHT): 2.96 cm     TR Peak grad:   24.4 mmHg MV Decel Time: 256 msec     TR Vmax:        247.00 cm/s MV E velocity: 59.60 cm/s MV A velocity: 113.00 cm/s  SHUNTS MV E/A ratio:  0.53         Systemic VTI:  0.18 m                             Systemic Diam: 2.00 cm Serafina Royals MD Electronically signed by Serafina Royals MD Signature Date/Time: 01/08/2021/8:15:41 AM    Final      Labs:   Basic Metabolic Panel: Recent Labs  Lab 01/07/21 0126 01/07/21 0626  NA 138 136  K 3.8 3.8  CL 101 102  CO2 28 24  GLUCOSE 124* 158*  BUN 16 17  CREATININE 1.05 1.02  CALCIUM 9.1 8.8*   GFR Estimated Creatinine Clearance: 77 mL/min (by C-G formula based on SCr of 1.02 mg/dL). Liver Function Tests: No results for input(s): AST, ALT, ALKPHOS, BILITOT, PROT, ALBUMIN in the last 168 hours. No results for input(s): LIPASE, AMYLASE in the last 168 hours. No results  for input(s): AMMONIA in the last 168  hours. Coagulation profile No results for input(s): INR, PROTIME in the last 168 hours.  CBC: Recent Labs  Lab 01/07/21 0126 01/07/21 0545  WBC 12.3* 9.9  NEUTROABS 7.7  --   HGB 16.4 16.3  HCT 48.6 46.7  MCV 94.7 93.4  PLT 168 165   Cardiac Enzymes: No results for input(s): CKTOTAL, CKMB, CKMBINDEX, TROPONINI in the last 168 hours. BNP: Invalid input(s): POCBNP CBG: No results for input(s): GLUCAP in the last 168 hours. D-Dimer No results for input(s): DDIMER in the last 72 hours. Hgb A1c Recent Labs    01/07/21 0545  HGBA1C 6.2*   Lipid Profile No results for input(s): CHOL, HDL, LDLCALC, TRIG, CHOLHDL, LDLDIRECT in the last 72 hours. Thyroid function studies No results for input(s): TSH, T4TOTAL, T3FREE, THYROIDAB in the last 72 hours.  Invalid input(s): FREET3 Anemia work up No results for input(s): VITAMINB12, FOLATE, FERRITIN, TIBC, IRON, RETICCTPCT in the last 72 hours. Microbiology Recent Results (from the past 240 hour(s))  Resp Panel by RT-PCR (Flu A&B, Covid) Nasopharyngeal Swab     Status: None   Collection Time: 01/07/21  1:56 AM   Specimen: Nasopharyngeal Swab; Nasopharyngeal(NP) swabs in vial transport medium  Result Value Ref Range Status   SARS Coronavirus 2 by RT PCR NEGATIVE NEGATIVE Final    Comment: (NOTE) SARS-CoV-2 target nucleic acids are NOT DETECTED.  The SARS-CoV-2 RNA is generally detectable in upper respiratory specimens during the acute phase of infection. The lowest concentration of SARS-CoV-2 viral copies this assay can detect is 138 copies/mL. A negative result does not preclude SARS-Cov-2 infection and should not be used as the sole basis for treatment or other patient management decisions. A negative result may occur with  improper specimen collection/handling, submission of specimen other than nasopharyngeal swab, presence of viral mutation(s) within the areas targeted by this assay, and inadequate number of  viral copies(<138 copies/mL). A negative result must be combined with clinical observations, patient history, and epidemiological information. The expected result is Negative.  Fact Sheet for Patients:  EntrepreneurPulse.com.au  Fact Sheet for Healthcare Providers:  IncredibleEmployment.be  This test is no t yet approved or cleared by the [place] FDA and  has been authorized for detection and/or diagnosis of SARS-CoV-2 by FDA under an Emergency Use Authorization (EUA). This EUA will remain  in effect (meaning this test can be used) for the duration of the COVID-19 declaration under Section 564(b)(1) of the Act, 21 U.S.C.section 360bbb-3(b)(1), unless the authorization is terminated  or revoked sooner.       Influenza A by PCR NEGATIVE NEGATIVE Final   Influenza B by PCR NEGATIVE NEGATIVE Final    Comment: (NOTE) The Xpert Xpress SARS-CoV-2/FLU/RSV plus assay is intended as an aid in the diagnosis of influenza from Nasopharyngeal swab specimens and should not be used as a sole basis for treatment. Nasal washings and aspirates are unacceptable for Xpert Xpress SARS-CoV-2/FLU/RSV testing.  Fact Sheet for Patients: EntrepreneurPulse.com.au  Fact Sheet for Healthcare Providers: IncredibleEmployment.be  This test is not yet approved or cleared by the [place] FDA and has been authorized for detection and/or diagnosis of SARS-CoV-2 by FDA under an Emergency Use Authorization (EUA). This EUA will remain in effect (meaning this test can be used) for the duration of the COVID-19 declaration under Section 564(b)(1) of the Act, 21 U.S.C. section 360bbb-3(b)(1), unless the authorization is terminated or revoked.  Performed at [facility], [address] [place],  [place] [address]   Culture, blood (Routine X 2) w Reflex to ID Panel     Status: None (Preliminary result)   Collection Time:  01/07/21  5:45 AM   Specimen: BLOOD  Result Value Ref Range Status   Specimen Description BLOOD BLOOD LEFT WRIST  Final   Special Requests   Final    BOTTLES DRAWN AEROBIC AND ANAEROBIC Blood Culture adequate volume   Culture   Final    NO GROWTH 1 DAY Performed at [facility], [address] [place], [place] [address]    Report Status PENDING  Incomplete  Culture, blood (Routine X 2) w Reflex to ID Panel     Status: None (Preliminary result)   Collection Time: 01/07/21  5:45 AM   Specimen: BLOOD  Result Value Ref Range Status   Specimen Description BLOOD BLOOD LEFT FOREARM  Final   Special Requests   Final    BOTTLES DRAWN AEROBIC AND ANAEROBIC Blood Culture adequate volume   Culture   Final    NO GROWTH 1 DAY Performed at [facility], [address], [place], [place] [address]    Report Status PENDING  Incomplete  Expectorated sputum assessment w rflx to resp cult     Status: None   Collection Time: 01/08/21 10:00 AM   Specimen: Sputum  Result Value Ref Range Status   Specimen Description SPUTUM  Final   Special Requests NONE  Final   Sputum evaluation   Final    THIS SPECIMEN IS ACCEPTABLE FOR SPUTUM CULTURE Performed at [facility], [address] [place], [place] [address]    Report Status 01/08/2021 FINAL  Final     Signed: Marlowe Aschoff Weronika Birch  [facility] 01/08/2021, 1:44 PM

## 2021-01-08 NOTE — Care Management CC44 (Signed)
Condition Code 44 Documentation Completed  Patient Details  Name: Roy Nelson MRN: [id number] Date of Birth: October 29, 1945   Condition Code 44 given:  Yes Patient signature on Condition Code 44 notice:  Yes Documentation of 2 MD's agreement:  Yes Code 44 added to claim:  Yes    Beverly Sessions, RN 01/08/2021, 2:43 PM

## 2021-01-09 DIAGNOSIS — J441 Chronic obstructive pulmonary disease with (acute) exacerbation: Secondary | ICD-10-CM | POA: Diagnosis not present

## 2021-01-10 LAB — CULTURE, RESPIRATORY W GRAM STAIN: Culture: NORMAL

## 2021-01-12 LAB — CULTURE, BLOOD (ROUTINE X 2)
Culture: NO GROWTH
Culture: NO GROWTH
Special Requests: ADEQUATE
Special Requests: ADEQUATE

## 2021-02-09 DIAGNOSIS — J441 Chronic obstructive pulmonary disease with (acute) exacerbation: Secondary | ICD-10-CM | POA: Diagnosis not present

## 2021-03-11 DIAGNOSIS — J441 Chronic obstructive pulmonary disease with (acute) exacerbation: Secondary | ICD-10-CM | POA: Diagnosis not present

## 2021-04-10 ENCOUNTER — Other Ambulatory Visit: Payer: Self-pay

## 2021-04-10 MED ORDER — FLUTICASONE-UMECLIDIN-VILANT 100-62.5-25 MCG/ACT IN AEPB
INHALATION_SPRAY | RESPIRATORY_TRACT | 2 refills | Status: DC
  Filled 2021-04-10 – 2021-04-16 (×2): qty 60, 30d supply, fill #0
  Filled 2021-05-17: qty 60, 30d supply, fill #1
  Filled 2021-06-18: qty 60, 30d supply, fill #2

## 2021-04-11 ENCOUNTER — Other Ambulatory Visit: Payer: Self-pay

## 2021-04-16 ENCOUNTER — Ambulatory Visit: Payer: PPO | Admitting: Pharmacy Technician

## 2021-04-16 ENCOUNTER — Other Ambulatory Visit: Payer: Self-pay

## 2021-04-16 DIAGNOSIS — Z79899 Other long term (current) drug therapy: Secondary | ICD-10-CM

## 2021-04-17 ENCOUNTER — Other Ambulatory Visit: Payer: Self-pay

## 2021-04-17 MED ORDER — OMEPRAZOLE 20 MG PO CPDR
20.0000 mg | DELAYED_RELEASE_CAPSULE | Freq: Every day | ORAL | 11 refills | Status: AC
  Filled 2021-04-17: qty 30, 30d supply, fill #0
  Filled 2021-06-18 (×2): qty 30, 30d supply, fill #1
  Filled 2021-07-18: qty 30, 30d supply, fill #2
  Filled 2021-08-14: qty 30, 30d supply, fill #3

## 2021-04-17 NOTE — Progress Notes (Signed)
Completed [facility] application and contract.  Patient agreed to all terms of the [facility] contract.  Patient to provide pharmacy printout for all medications purchased since August 18, 2020.  Also, made patient aware that pharmacy printout needs to be signed and dated by the Pharmacist.  Patient also needs to provide Social Security award letter and checking account statement and utility bill.  Patient is in the Medicare Part D coverage gap.    Provided patient with application to be completed to be screened for the Medicare Savings Plan.  Application completed during intake appointment with the help from a relative, [employer].  Patient and Pat Dlouhy will arrange to take application to DSS.    Explained that if patient is approved for L.I.S., low income subsidy then co-pays would be reduced and patient would not get in the coverage gap.  Also, made patient aware that [facility] could no longer provide medication assistance for the Protonix once approved for L.I.S.  Completed PAP application for Trelegy.  Made patient aware that [facility] requires that patient spend $600 out of pocket on medications before they will provide medication assistance.  Patient and Jakoda Mullings both acknowledged that they understood.    Patient to contact Dr. Baldemar Lenis and have prescription for Protonix sent to [facility] to fill.  Patient is taking other medications, but stated that the co-pays for the other medications were affordable.  Could not afford the co-pay for Trelegy.  Patient is only asking for medication assistance for Trelegy and Protonix.     [place] [facility]

## 2021-05-14 DIAGNOSIS — Z6831 Body mass index (BMI) 31.0-31.9, adult: Secondary | ICD-10-CM | POA: Diagnosis not present

## 2021-05-14 DIAGNOSIS — J449 Chronic obstructive pulmonary disease, unspecified: Secondary | ICD-10-CM | POA: Diagnosis not present

## 2021-05-14 DIAGNOSIS — E6609 Other obesity due to excess calories: Secondary | ICD-10-CM | POA: Diagnosis not present

## 2021-05-14 DIAGNOSIS — Z01818 Encounter for other preprocedural examination: Secondary | ICD-10-CM | POA: Diagnosis not present

## 2021-05-14 DIAGNOSIS — G4734 Idiopathic sleep related nonobstructive alveolar hypoventilation: Secondary | ICD-10-CM | POA: Diagnosis not present

## 2021-05-16 ENCOUNTER — Other Ambulatory Visit: Payer: Self-pay

## 2021-05-17 ENCOUNTER — Other Ambulatory Visit: Payer: Self-pay

## 2021-05-20 ENCOUNTER — Other Ambulatory Visit: Payer: Self-pay

## 2021-05-28 ENCOUNTER — Other Ambulatory Visit: Payer: Self-pay

## 2021-06-04 DIAGNOSIS — Z125 Encounter for screening for malignant neoplasm of prostate: Secondary | ICD-10-CM | POA: Diagnosis not present

## 2021-06-04 DIAGNOSIS — R739 Hyperglycemia, unspecified: Secondary | ICD-10-CM | POA: Diagnosis not present

## 2021-06-04 DIAGNOSIS — Z79899 Other long term (current) drug therapy: Secondary | ICD-10-CM | POA: Diagnosis not present

## 2021-06-04 DIAGNOSIS — E7800 Pure hypercholesterolemia, unspecified: Secondary | ICD-10-CM | POA: Diagnosis not present

## 2021-06-11 DIAGNOSIS — E7800 Pure hypercholesterolemia, unspecified: Secondary | ICD-10-CM | POA: Diagnosis not present

## 2021-06-11 DIAGNOSIS — Z1331 Encounter for screening for depression: Secondary | ICD-10-CM | POA: Diagnosis not present

## 2021-06-11 DIAGNOSIS — Z1211 Encounter for screening for malignant neoplasm of colon: Secondary | ICD-10-CM | POA: Diagnosis not present

## 2021-06-11 DIAGNOSIS — Z0000 Encounter for general adult medical examination without abnormal findings: Secondary | ICD-10-CM | POA: Diagnosis not present

## 2021-06-11 DIAGNOSIS — Z79899 Other long term (current) drug therapy: Secondary | ICD-10-CM | POA: Diagnosis not present

## 2021-06-11 DIAGNOSIS — Z23 Encounter for immunization: Secondary | ICD-10-CM | POA: Diagnosis not present

## 2021-06-18 ENCOUNTER — Other Ambulatory Visit: Payer: Self-pay

## 2021-06-21 ENCOUNTER — Other Ambulatory Visit: Payer: Self-pay

## 2021-06-24 ENCOUNTER — Other Ambulatory Visit: Payer: Self-pay

## 2021-06-24 DIAGNOSIS — G4730 Sleep apnea, unspecified: Secondary | ICD-10-CM | POA: Diagnosis not present

## 2021-06-24 DIAGNOSIS — R0683 Snoring: Secondary | ICD-10-CM | POA: Diagnosis not present

## 2021-07-08 ENCOUNTER — Other Ambulatory Visit: Payer: Self-pay

## 2021-07-08 MED ORDER — FLUTICASONE-UMECLIDIN-VILANT 100-62.5-25 MCG/ACT IN AEPB
INHALATION_SPRAY | RESPIRATORY_TRACT | 2 refills | Status: DC
  Filled 2021-07-08: qty 60, fill #0

## 2021-07-09 DIAGNOSIS — E6609 Other obesity due to excess calories: Secondary | ICD-10-CM | POA: Diagnosis not present

## 2021-07-09 DIAGNOSIS — G4734 Idiopathic sleep related nonobstructive alveolar hypoventilation: Secondary | ICD-10-CM | POA: Diagnosis not present

## 2021-07-09 DIAGNOSIS — J449 Chronic obstructive pulmonary disease, unspecified: Secondary | ICD-10-CM | POA: Diagnosis not present

## 2021-07-09 DIAGNOSIS — Z6831 Body mass index (BMI) 31.0-31.9, adult: Secondary | ICD-10-CM | POA: Diagnosis not present

## 2021-07-09 DIAGNOSIS — I10 Essential (primary) hypertension: Secondary | ICD-10-CM | POA: Diagnosis not present

## 2021-07-09 DIAGNOSIS — E7849 Other hyperlipidemia: Secondary | ICD-10-CM | POA: Diagnosis not present

## 2021-07-18 ENCOUNTER — Other Ambulatory Visit: Payer: Self-pay

## 2021-08-05 ENCOUNTER — Other Ambulatory Visit: Payer: Self-pay

## 2021-08-05 DIAGNOSIS — J441 Chronic obstructive pulmonary disease with (acute) exacerbation: Secondary | ICD-10-CM | POA: Diagnosis not present

## 2021-08-05 MED ORDER — FLUTICASONE-UMECLIDIN-VILANT 100-62.5-25 MCG/ACT IN AEPB
INHALATION_SPRAY | RESPIRATORY_TRACT | 2 refills | Status: DC
  Filled 2021-08-05: qty 60, 30d supply, fill #0
  Filled 2021-09-04: qty 60, 30d supply, fill #1

## 2021-08-06 ENCOUNTER — Other Ambulatory Visit: Payer: Self-pay

## 2021-08-14 ENCOUNTER — Other Ambulatory Visit: Payer: Self-pay

## 2021-08-15 ENCOUNTER — Other Ambulatory Visit: Payer: Self-pay

## 2021-08-26 ENCOUNTER — Telehealth: Payer: Self-pay | Admitting: Pharmacy Technician

## 2021-08-26 NOTE — Telephone Encounter (Signed)
Patient out of Medicare Coverage Gap.  Medicare Part D started over 08/18/21.  [place] no longer providing medication assistance.  [place] [facility]

## 2021-09-02 ENCOUNTER — Other Ambulatory Visit: Payer: Self-pay

## 2021-09-04 ENCOUNTER — Other Ambulatory Visit: Payer: Self-pay

## 2021-09-04 MED ORDER — FLUTICASONE-UMECLIDIN-VILANT 100-62.5-25 MCG/ACT IN AEPB
INHALATION_SPRAY | RESPIRATORY_TRACT | 2 refills | Status: AC
  Filled 2021-09-04: qty 180, 90d supply, fill #0

## 2021-09-05 ENCOUNTER — Other Ambulatory Visit: Payer: Self-pay

## 2021-09-07 DIAGNOSIS — J441 Chronic obstructive pulmonary disease with (acute) exacerbation: Secondary | ICD-10-CM | POA: Diagnosis not present

## 2021-09-12 ENCOUNTER — Other Ambulatory Visit: Payer: Self-pay

## 2021-09-23 ENCOUNTER — Other Ambulatory Visit (HOSPITAL_BASED_OUTPATIENT_CLINIC_OR_DEPARTMENT_OTHER): Payer: Self-pay

## 2021-10-01 ENCOUNTER — Other Ambulatory Visit: Payer: Self-pay

## 2021-10-07 ENCOUNTER — Other Ambulatory Visit: Payer: Self-pay

## 2021-10-08 DIAGNOSIS — J441 Chronic obstructive pulmonary disease with (acute) exacerbation: Secondary | ICD-10-CM | POA: Diagnosis not present

## 2021-10-14 DIAGNOSIS — R0609 Other forms of dyspnea: Secondary | ICD-10-CM | POA: Diagnosis not present

## 2021-10-14 DIAGNOSIS — J449 Chronic obstructive pulmonary disease, unspecified: Secondary | ICD-10-CM | POA: Diagnosis not present

## 2021-10-14 DIAGNOSIS — J310 Chronic rhinitis: Secondary | ICD-10-CM | POA: Diagnosis not present

## 2021-10-28 ENCOUNTER — Other Ambulatory Visit: Payer: Self-pay

## 2021-11-05 DIAGNOSIS — J441 Chronic obstructive pulmonary disease with (acute) exacerbation: Secondary | ICD-10-CM | POA: Diagnosis not present

## 2021-12-03 DIAGNOSIS — R739 Hyperglycemia, unspecified: Secondary | ICD-10-CM | POA: Diagnosis not present

## 2021-12-03 DIAGNOSIS — Z79899 Other long term (current) drug therapy: Secondary | ICD-10-CM | POA: Diagnosis not present

## 2021-12-03 DIAGNOSIS — E7800 Pure hypercholesterolemia, unspecified: Secondary | ICD-10-CM | POA: Diagnosis not present

## 2021-12-06 DIAGNOSIS — J441 Chronic obstructive pulmonary disease with (acute) exacerbation: Secondary | ICD-10-CM | POA: Diagnosis not present

## 2021-12-10 DIAGNOSIS — Z79899 Other long term (current) drug therapy: Secondary | ICD-10-CM | POA: Diagnosis not present

## 2021-12-10 DIAGNOSIS — J449 Chronic obstructive pulmonary disease, unspecified: Secondary | ICD-10-CM | POA: Diagnosis not present

## 2021-12-10 DIAGNOSIS — R29898 Other symptoms and signs involving the musculoskeletal system: Secondary | ICD-10-CM | POA: Diagnosis not present

## 2021-12-10 DIAGNOSIS — N400 Enlarged prostate without lower urinary tract symptoms: Secondary | ICD-10-CM | POA: Diagnosis not present

## 2021-12-10 DIAGNOSIS — Z125 Encounter for screening for malignant neoplasm of prostate: Secondary | ICD-10-CM | POA: Diagnosis not present

## 2021-12-10 DIAGNOSIS — R972 Elevated prostate specific antigen [PSA]: Secondary | ICD-10-CM | POA: Diagnosis not present

## 2021-12-10 DIAGNOSIS — I10 Essential (primary) hypertension: Secondary | ICD-10-CM | POA: Diagnosis not present

## 2021-12-10 DIAGNOSIS — R739 Hyperglycemia, unspecified: Secondary | ICD-10-CM | POA: Diagnosis not present

## 2021-12-10 DIAGNOSIS — E7800 Pure hypercholesterolemia, unspecified: Secondary | ICD-10-CM | POA: Diagnosis not present

## 2021-12-12 DIAGNOSIS — R29898 Other symptoms and signs involving the musculoskeletal system: Secondary | ICD-10-CM | POA: Diagnosis not present

## 2021-12-12 DIAGNOSIS — M6281 Muscle weakness (generalized): Secondary | ICD-10-CM | POA: Diagnosis not present

## 2021-12-16 DIAGNOSIS — M6281 Muscle weakness (generalized): Secondary | ICD-10-CM | POA: Diagnosis not present

## 2021-12-16 DIAGNOSIS — R29898 Other symptoms and signs involving the musculoskeletal system: Secondary | ICD-10-CM | POA: Diagnosis not present

## 2021-12-19 DIAGNOSIS — M6281 Muscle weakness (generalized): Secondary | ICD-10-CM | POA: Diagnosis not present

## 2021-12-19 DIAGNOSIS — R29898 Other symptoms and signs involving the musculoskeletal system: Secondary | ICD-10-CM | POA: Diagnosis not present

## 2021-12-23 DIAGNOSIS — R29898 Other symptoms and signs involving the musculoskeletal system: Secondary | ICD-10-CM | POA: Diagnosis not present

## 2021-12-23 DIAGNOSIS — M6281 Muscle weakness (generalized): Secondary | ICD-10-CM | POA: Diagnosis not present

## 2021-12-26 DIAGNOSIS — M6281 Muscle weakness (generalized): Secondary | ICD-10-CM | POA: Diagnosis not present

## 2021-12-26 DIAGNOSIS — R29898 Other symptoms and signs involving the musculoskeletal system: Secondary | ICD-10-CM | POA: Diagnosis not present

## 2021-12-30 DIAGNOSIS — M6281 Muscle weakness (generalized): Secondary | ICD-10-CM | POA: Diagnosis not present

## 2021-12-30 DIAGNOSIS — R29898 Other symptoms and signs involving the musculoskeletal system: Secondary | ICD-10-CM | POA: Diagnosis not present

## 2022-01-02 DIAGNOSIS — M6281 Muscle weakness (generalized): Secondary | ICD-10-CM | POA: Diagnosis not present

## 2022-01-02 DIAGNOSIS — R29898 Other symptoms and signs involving the musculoskeletal system: Secondary | ICD-10-CM | POA: Diagnosis not present

## 2022-01-05 DIAGNOSIS — J441 Chronic obstructive pulmonary disease with (acute) exacerbation: Secondary | ICD-10-CM | POA: Diagnosis not present

## 2022-01-29 IMAGING — DX DG CHEST 1V PORT
1 series · 1 of 1 positions shown · non-contrast
Comparison: Chest x-ray 09/25/2014

CLINICAL DATA: Shortness of breath.

EXAM:
PORTABLE CHEST 1 VIEW

[chest ap]
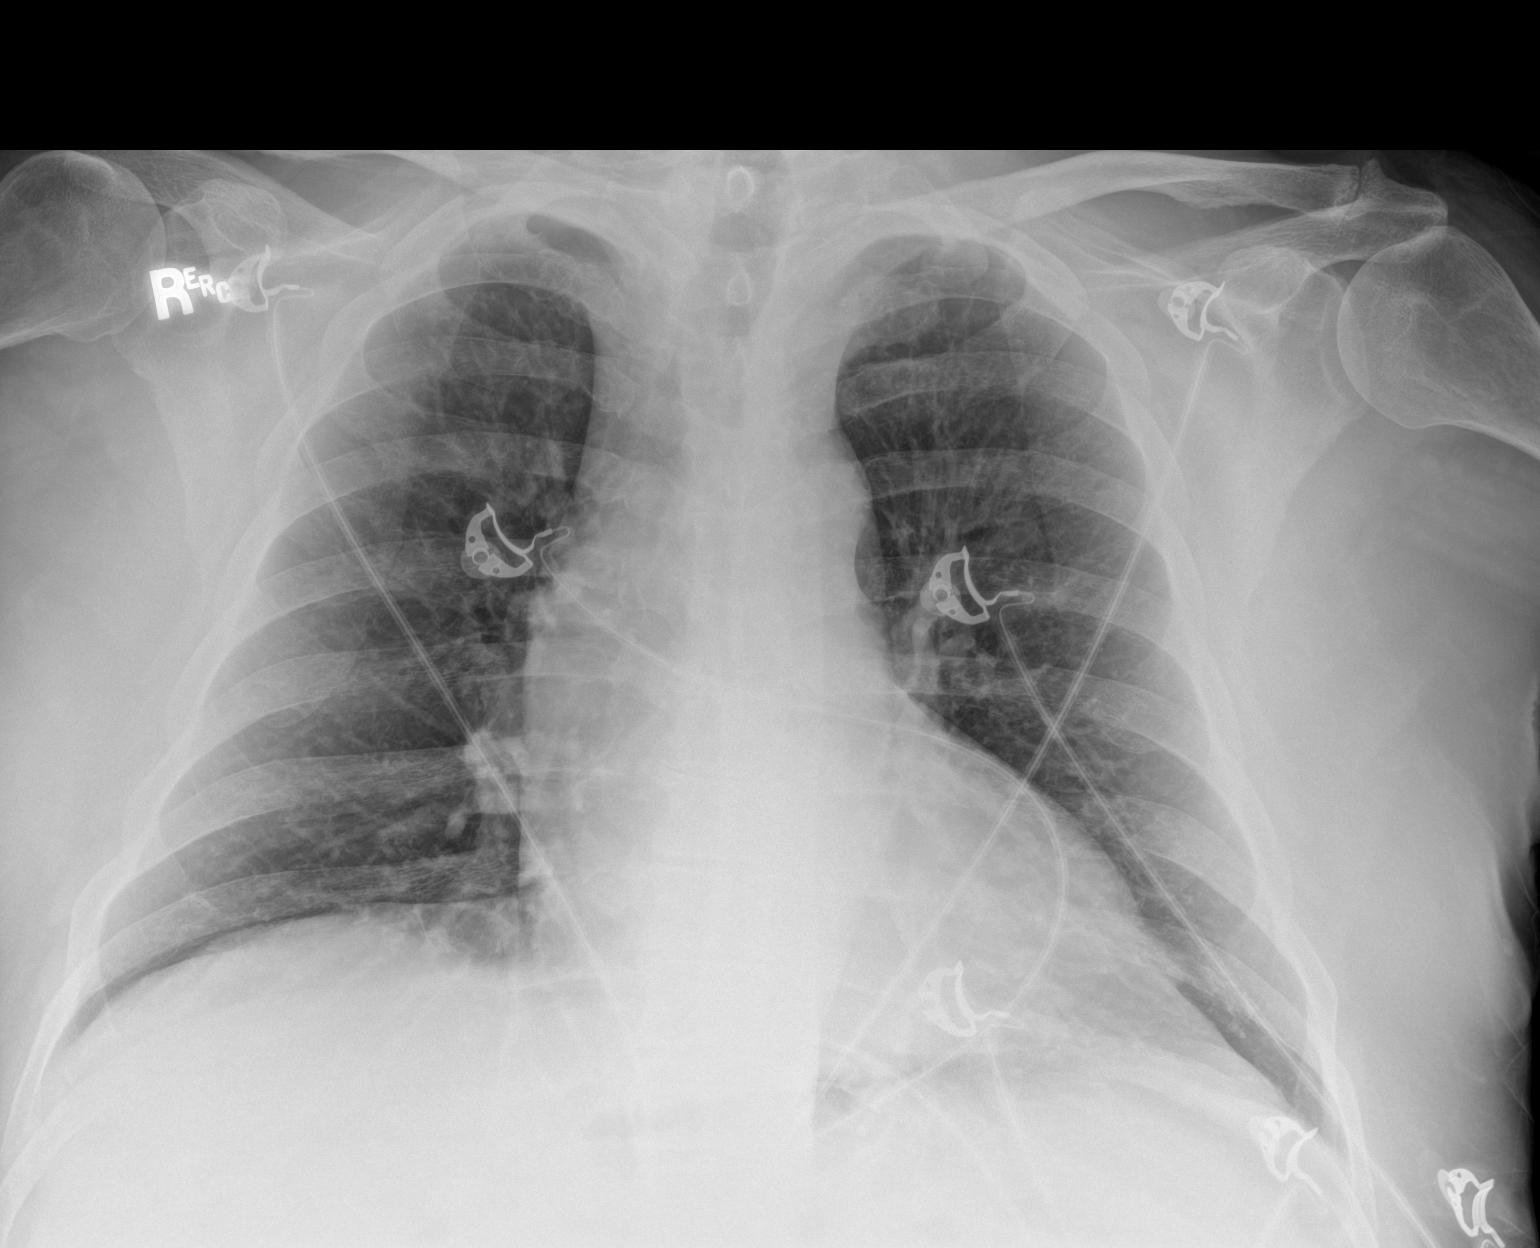

[1 of 1 positions shown; findings below may reference images not displayed]

FINDINGS: The heart size and mediastinal contours are within normal limits.

No focal consolidation. No pulmonary edema. No pleural effusion. No
pneumothorax.

No acute osseous abnormality. Bilateral acromioclavicular joint
degenerative changes.
IMPRESSION: No active disease.

## 2022-01-31 DIAGNOSIS — S80862A Insect bite (nonvenomous), left lower leg, initial encounter: Secondary | ICD-10-CM | POA: Diagnosis not present

## 2022-01-31 DIAGNOSIS — W57XXXA Bitten or stung by nonvenomous insect and other nonvenomous arthropods, initial encounter: Secondary | ICD-10-CM | POA: Diagnosis not present

## 2022-02-05 DIAGNOSIS — J441 Chronic obstructive pulmonary disease with (acute) exacerbation: Secondary | ICD-10-CM | POA: Diagnosis not present

## 2022-03-07 DIAGNOSIS — J441 Chronic obstructive pulmonary disease with (acute) exacerbation: Secondary | ICD-10-CM | POA: Diagnosis not present

## 2022-04-07 DIAGNOSIS — Z6831 Body mass index (BMI) 31.0-31.9, adult: Secondary | ICD-10-CM | POA: Diagnosis not present

## 2022-04-07 DIAGNOSIS — F419 Anxiety disorder, unspecified: Secondary | ICD-10-CM | POA: Diagnosis not present

## 2022-04-07 DIAGNOSIS — J441 Chronic obstructive pulmonary disease with (acute) exacerbation: Secondary | ICD-10-CM | POA: Diagnosis not present

## 2022-04-07 DIAGNOSIS — J449 Chronic obstructive pulmonary disease, unspecified: Secondary | ICD-10-CM | POA: Diagnosis not present

## 2022-04-07 DIAGNOSIS — E7849 Other hyperlipidemia: Secondary | ICD-10-CM | POA: Diagnosis not present

## 2022-04-07 DIAGNOSIS — I10 Essential (primary) hypertension: Secondary | ICD-10-CM | POA: Diagnosis not present

## 2022-04-07 DIAGNOSIS — E6609 Other obesity due to excess calories: Secondary | ICD-10-CM | POA: Diagnosis not present

## 2022-04-11 DIAGNOSIS — D2272 Melanocytic nevi of left lower limb, including hip: Secondary | ICD-10-CM | POA: Diagnosis not present

## 2022-04-11 DIAGNOSIS — X32XXXA Exposure to sunlight, initial encounter: Secondary | ICD-10-CM | POA: Diagnosis not present

## 2022-04-11 DIAGNOSIS — D485 Neoplasm of uncertain behavior of skin: Secondary | ICD-10-CM | POA: Diagnosis not present

## 2022-04-11 DIAGNOSIS — D225 Melanocytic nevi of trunk: Secondary | ICD-10-CM | POA: Diagnosis not present

## 2022-04-11 DIAGNOSIS — L821 Other seborrheic keratosis: Secondary | ICD-10-CM | POA: Diagnosis not present

## 2022-04-11 DIAGNOSIS — D2261 Melanocytic nevi of right upper limb, including shoulder: Secondary | ICD-10-CM | POA: Diagnosis not present

## 2022-04-11 DIAGNOSIS — L570 Actinic keratosis: Secondary | ICD-10-CM | POA: Diagnosis not present

## 2022-04-11 DIAGNOSIS — D2262 Melanocytic nevi of left upper limb, including shoulder: Secondary | ICD-10-CM | POA: Diagnosis not present

## 2022-04-11 DIAGNOSIS — D2271 Melanocytic nevi of right lower limb, including hip: Secondary | ICD-10-CM | POA: Diagnosis not present

## 2022-04-11 DIAGNOSIS — C44529 Squamous cell carcinoma of skin of other part of trunk: Secondary | ICD-10-CM | POA: Diagnosis not present

## 2022-04-14 DIAGNOSIS — R0609 Other forms of dyspnea: Secondary | ICD-10-CM | POA: Diagnosis not present

## 2022-04-14 DIAGNOSIS — G4734 Idiopathic sleep related nonobstructive alveolar hypoventilation: Secondary | ICD-10-CM | POA: Diagnosis not present

## 2022-04-14 DIAGNOSIS — J302 Other seasonal allergic rhinitis: Secondary | ICD-10-CM | POA: Diagnosis not present

## 2022-04-14 DIAGNOSIS — J449 Chronic obstructive pulmonary disease, unspecified: Secondary | ICD-10-CM | POA: Diagnosis not present

## 2022-05-08 DIAGNOSIS — J441 Chronic obstructive pulmonary disease with (acute) exacerbation: Secondary | ICD-10-CM | POA: Diagnosis not present

## 2022-05-19 DIAGNOSIS — D235 Other benign neoplasm of skin of trunk: Secondary | ICD-10-CM | POA: Diagnosis not present

## 2022-05-19 DIAGNOSIS — C44529 Squamous cell carcinoma of skin of other part of trunk: Secondary | ICD-10-CM | POA: Diagnosis not present

## 2022-06-06 DIAGNOSIS — Z125 Encounter for screening for malignant neoplasm of prostate: Secondary | ICD-10-CM | POA: Diagnosis not present

## 2022-06-06 DIAGNOSIS — E7800 Pure hypercholesterolemia, unspecified: Secondary | ICD-10-CM | POA: Diagnosis not present

## 2022-06-06 DIAGNOSIS — R739 Hyperglycemia, unspecified: Secondary | ICD-10-CM | POA: Diagnosis not present

## 2022-06-06 DIAGNOSIS — Z79899 Other long term (current) drug therapy: Secondary | ICD-10-CM | POA: Diagnosis not present

## 2022-06-07 DIAGNOSIS — J441 Chronic obstructive pulmonary disease with (acute) exacerbation: Secondary | ICD-10-CM | POA: Diagnosis not present

## 2022-06-13 DIAGNOSIS — E7800 Pure hypercholesterolemia, unspecified: Secondary | ICD-10-CM | POA: Diagnosis not present

## 2022-06-13 DIAGNOSIS — Z1331 Encounter for screening for depression: Secondary | ICD-10-CM | POA: Diagnosis not present

## 2022-06-13 DIAGNOSIS — R739 Hyperglycemia, unspecified: Secondary | ICD-10-CM | POA: Diagnosis not present

## 2022-06-13 DIAGNOSIS — I10 Essential (primary) hypertension: Secondary | ICD-10-CM | POA: Diagnosis not present

## 2022-06-13 DIAGNOSIS — Z23 Encounter for immunization: Secondary | ICD-10-CM | POA: Diagnosis not present

## 2022-06-13 DIAGNOSIS — Z0000 Encounter for general adult medical examination without abnormal findings: Secondary | ICD-10-CM | POA: Diagnosis not present

## 2022-06-13 DIAGNOSIS — Z79899 Other long term (current) drug therapy: Secondary | ICD-10-CM | POA: Diagnosis not present

## 2022-06-13 DIAGNOSIS — J4489 Other specified chronic obstructive pulmonary disease: Secondary | ICD-10-CM | POA: Diagnosis not present

## 2022-08-14 DIAGNOSIS — G4719 Other hypersomnia: Secondary | ICD-10-CM | POA: Diagnosis not present

## 2022-08-14 DIAGNOSIS — J449 Chronic obstructive pulmonary disease, unspecified: Secondary | ICD-10-CM | POA: Diagnosis not present

## 2022-08-14 DIAGNOSIS — J301 Allergic rhinitis due to pollen: Secondary | ICD-10-CM | POA: Diagnosis not present

## 2022-08-14 DIAGNOSIS — R052 Subacute cough: Secondary | ICD-10-CM | POA: Diagnosis not present

## 2022-08-14 DIAGNOSIS — R0609 Other forms of dyspnea: Secondary | ICD-10-CM | POA: Diagnosis not present

## 2022-08-14 DIAGNOSIS — E663 Overweight: Secondary | ICD-10-CM | POA: Diagnosis not present

## 2022-10-03 DIAGNOSIS — G4733 Obstructive sleep apnea (adult) (pediatric): Secondary | ICD-10-CM | POA: Diagnosis not present

## 2022-11-07 DIAGNOSIS — G4733 Obstructive sleep apnea (adult) (pediatric): Secondary | ICD-10-CM | POA: Diagnosis not present

## 2022-11-18 DIAGNOSIS — X32XXXA Exposure to sunlight, initial encounter: Secondary | ICD-10-CM | POA: Diagnosis not present

## 2022-11-18 DIAGNOSIS — D225 Melanocytic nevi of trunk: Secondary | ICD-10-CM | POA: Diagnosis not present

## 2022-11-18 DIAGNOSIS — L820 Inflamed seborrheic keratosis: Secondary | ICD-10-CM | POA: Diagnosis not present

## 2022-11-18 DIAGNOSIS — D2271 Melanocytic nevi of right lower limb, including hip: Secondary | ICD-10-CM | POA: Diagnosis not present

## 2022-11-18 DIAGNOSIS — Z85828 Personal history of other malignant neoplasm of skin: Secondary | ICD-10-CM | POA: Diagnosis not present

## 2022-11-18 DIAGNOSIS — D2262 Melanocytic nevi of left upper limb, including shoulder: Secondary | ICD-10-CM | POA: Diagnosis not present

## 2022-11-18 DIAGNOSIS — D2261 Melanocytic nevi of right upper limb, including shoulder: Secondary | ICD-10-CM | POA: Diagnosis not present

## 2022-11-18 DIAGNOSIS — D485 Neoplasm of uncertain behavior of skin: Secondary | ICD-10-CM | POA: Diagnosis not present

## 2022-11-18 DIAGNOSIS — L821 Other seborrheic keratosis: Secondary | ICD-10-CM | POA: Diagnosis not present

## 2022-11-18 DIAGNOSIS — L570 Actinic keratosis: Secondary | ICD-10-CM | POA: Diagnosis not present

## 2022-11-21 DIAGNOSIS — J441 Chronic obstructive pulmonary disease with (acute) exacerbation: Secondary | ICD-10-CM | POA: Diagnosis not present

## 2022-11-21 DIAGNOSIS — G4733 Obstructive sleep apnea (adult) (pediatric): Secondary | ICD-10-CM | POA: Diagnosis not present

## 2022-12-08 DIAGNOSIS — G4733 Obstructive sleep apnea (adult) (pediatric): Secondary | ICD-10-CM | POA: Diagnosis not present

## 2022-12-11 DIAGNOSIS — R739 Hyperglycemia, unspecified: Secondary | ICD-10-CM | POA: Diagnosis not present

## 2022-12-11 DIAGNOSIS — Z79899 Other long term (current) drug therapy: Secondary | ICD-10-CM | POA: Diagnosis not present

## 2022-12-11 DIAGNOSIS — E7800 Pure hypercholesterolemia, unspecified: Secondary | ICD-10-CM | POA: Diagnosis not present

## 2022-12-15 DIAGNOSIS — G4733 Obstructive sleep apnea (adult) (pediatric): Secondary | ICD-10-CM | POA: Diagnosis not present

## 2022-12-15 DIAGNOSIS — G4734 Idiopathic sleep related nonobstructive alveolar hypoventilation: Secondary | ICD-10-CM | POA: Diagnosis not present

## 2022-12-15 DIAGNOSIS — J449 Chronic obstructive pulmonary disease, unspecified: Secondary | ICD-10-CM | POA: Diagnosis not present

## 2022-12-18 DIAGNOSIS — E7800 Pure hypercholesterolemia, unspecified: Secondary | ICD-10-CM | POA: Diagnosis not present

## 2022-12-18 DIAGNOSIS — Z79899 Other long term (current) drug therapy: Secondary | ICD-10-CM | POA: Diagnosis not present

## 2022-12-18 DIAGNOSIS — N400 Enlarged prostate without lower urinary tract symptoms: Secondary | ICD-10-CM | POA: Diagnosis not present

## 2022-12-18 DIAGNOSIS — R972 Elevated prostate specific antigen [PSA]: Secondary | ICD-10-CM | POA: Diagnosis not present

## 2022-12-18 DIAGNOSIS — I10 Essential (primary) hypertension: Secondary | ICD-10-CM | POA: Diagnosis not present

## 2022-12-18 DIAGNOSIS — J4489 Other specified chronic obstructive pulmonary disease: Secondary | ICD-10-CM | POA: Diagnosis not present

## 2022-12-18 DIAGNOSIS — M25561 Pain in right knee: Secondary | ICD-10-CM | POA: Diagnosis not present

## 2022-12-18 DIAGNOSIS — F419 Anxiety disorder, unspecified: Secondary | ICD-10-CM | POA: Diagnosis not present

## 2022-12-18 DIAGNOSIS — Z125 Encounter for screening for malignant neoplasm of prostate: Secondary | ICD-10-CM | POA: Diagnosis not present

## 2022-12-18 DIAGNOSIS — R739 Hyperglycemia, unspecified: Secondary | ICD-10-CM | POA: Diagnosis not present

## 2023-01-07 DIAGNOSIS — G4733 Obstructive sleep apnea (adult) (pediatric): Secondary | ICD-10-CM | POA: Diagnosis not present

## 2023-03-10 DIAGNOSIS — J449 Chronic obstructive pulmonary disease, unspecified: Secondary | ICD-10-CM | POA: Diagnosis not present

## 2023-03-26 DIAGNOSIS — G4733 Obstructive sleep apnea (adult) (pediatric): Secondary | ICD-10-CM | POA: Diagnosis not present

## 2023-03-26 DIAGNOSIS — J441 Chronic obstructive pulmonary disease with (acute) exacerbation: Secondary | ICD-10-CM | POA: Diagnosis not present

## 2023-04-06 DIAGNOSIS — E6609 Other obesity due to excess calories: Secondary | ICD-10-CM | POA: Diagnosis not present

## 2023-04-06 DIAGNOSIS — R251 Tremor, unspecified: Secondary | ICD-10-CM | POA: Diagnosis not present

## 2023-04-06 DIAGNOSIS — I10 Essential (primary) hypertension: Secondary | ICD-10-CM | POA: Diagnosis not present

## 2023-04-06 DIAGNOSIS — E7849 Other hyperlipidemia: Secondary | ICD-10-CM | POA: Diagnosis not present

## 2023-04-06 DIAGNOSIS — Z6831 Body mass index (BMI) 31.0-31.9, adult: Secondary | ICD-10-CM | POA: Diagnosis not present

## 2023-04-06 DIAGNOSIS — F419 Anxiety disorder, unspecified: Secondary | ICD-10-CM | POA: Diagnosis not present

## 2023-04-06 DIAGNOSIS — R0609 Other forms of dyspnea: Secondary | ICD-10-CM | POA: Diagnosis not present

## 2023-04-06 DIAGNOSIS — J449 Chronic obstructive pulmonary disease, unspecified: Secondary | ICD-10-CM | POA: Diagnosis not present

## 2023-04-10 DIAGNOSIS — J449 Chronic obstructive pulmonary disease, unspecified: Secondary | ICD-10-CM | POA: Diagnosis not present

## 2023-04-16 DIAGNOSIS — G4734 Idiopathic sleep related nonobstructive alveolar hypoventilation: Secondary | ICD-10-CM | POA: Diagnosis not present

## 2023-04-16 DIAGNOSIS — R0602 Shortness of breath: Secondary | ICD-10-CM | POA: Diagnosis not present

## 2023-04-16 DIAGNOSIS — R053 Chronic cough: Secondary | ICD-10-CM | POA: Diagnosis not present

## 2023-04-16 DIAGNOSIS — J449 Chronic obstructive pulmonary disease, unspecified: Secondary | ICD-10-CM | POA: Diagnosis not present

## 2023-04-16 DIAGNOSIS — J301 Allergic rhinitis due to pollen: Secondary | ICD-10-CM | POA: Diagnosis not present

## 2023-05-11 DIAGNOSIS — J449 Chronic obstructive pulmonary disease, unspecified: Secondary | ICD-10-CM | POA: Diagnosis not present

## 2023-05-26 DIAGNOSIS — C44629 Squamous cell carcinoma of skin of left upper limb, including shoulder: Secondary | ICD-10-CM | POA: Diagnosis not present

## 2023-05-26 DIAGNOSIS — D2271 Melanocytic nevi of right lower limb, including hip: Secondary | ICD-10-CM | POA: Diagnosis not present

## 2023-05-26 DIAGNOSIS — L570 Actinic keratosis: Secondary | ICD-10-CM | POA: Diagnosis not present

## 2023-05-26 DIAGNOSIS — L821 Other seborrheic keratosis: Secondary | ICD-10-CM | POA: Diagnosis not present

## 2023-05-26 DIAGNOSIS — D2262 Melanocytic nevi of left upper limb, including shoulder: Secondary | ICD-10-CM | POA: Diagnosis not present

## 2023-05-26 DIAGNOSIS — C44619 Basal cell carcinoma of skin of left upper limb, including shoulder: Secondary | ICD-10-CM | POA: Diagnosis not present

## 2023-05-26 DIAGNOSIS — D485 Neoplasm of uncertain behavior of skin: Secondary | ICD-10-CM | POA: Diagnosis not present

## 2023-05-26 DIAGNOSIS — D0461 Carcinoma in situ of skin of right upper limb, including shoulder: Secondary | ICD-10-CM | POA: Diagnosis not present

## 2023-05-26 DIAGNOSIS — Z85828 Personal history of other malignant neoplasm of skin: Secondary | ICD-10-CM | POA: Diagnosis not present

## 2023-05-26 DIAGNOSIS — D2261 Melanocytic nevi of right upper limb, including shoulder: Secondary | ICD-10-CM | POA: Diagnosis not present

## 2023-05-26 DIAGNOSIS — D225 Melanocytic nevi of trunk: Secondary | ICD-10-CM | POA: Diagnosis not present

## 2023-06-10 DIAGNOSIS — J449 Chronic obstructive pulmonary disease, unspecified: Secondary | ICD-10-CM | POA: Diagnosis not present

## 2023-06-12 DIAGNOSIS — Z125 Encounter for screening for malignant neoplasm of prostate: Secondary | ICD-10-CM | POA: Diagnosis not present

## 2023-06-12 DIAGNOSIS — E7800 Pure hypercholesterolemia, unspecified: Secondary | ICD-10-CM | POA: Diagnosis not present

## 2023-06-12 DIAGNOSIS — R739 Hyperglycemia, unspecified: Secondary | ICD-10-CM | POA: Diagnosis not present

## 2023-06-12 DIAGNOSIS — Z79899 Other long term (current) drug therapy: Secondary | ICD-10-CM | POA: Diagnosis not present

## 2023-06-16 DIAGNOSIS — D0461 Carcinoma in situ of skin of right upper limb, including shoulder: Secondary | ICD-10-CM | POA: Diagnosis not present

## 2023-06-16 DIAGNOSIS — C44619 Basal cell carcinoma of skin of left upper limb, including shoulder: Secondary | ICD-10-CM | POA: Diagnosis not present

## 2023-06-19 DIAGNOSIS — R7303 Prediabetes: Secondary | ICD-10-CM | POA: Diagnosis not present

## 2023-06-19 DIAGNOSIS — F419 Anxiety disorder, unspecified: Secondary | ICD-10-CM | POA: Diagnosis not present

## 2023-06-19 DIAGNOSIS — Z23 Encounter for immunization: Secondary | ICD-10-CM | POA: Diagnosis not present

## 2023-06-19 DIAGNOSIS — I10 Essential (primary) hypertension: Secondary | ICD-10-CM | POA: Diagnosis not present

## 2023-06-19 DIAGNOSIS — J4489 Other specified chronic obstructive pulmonary disease: Secondary | ICD-10-CM | POA: Diagnosis not present

## 2023-06-19 DIAGNOSIS — E7800 Pure hypercholesterolemia, unspecified: Secondary | ICD-10-CM | POA: Diagnosis not present

## 2023-06-19 DIAGNOSIS — Z0000 Encounter for general adult medical examination without abnormal findings: Secondary | ICD-10-CM | POA: Diagnosis not present

## 2023-06-19 DIAGNOSIS — Z1331 Encounter for screening for depression: Secondary | ICD-10-CM | POA: Diagnosis not present

## 2023-06-19 DIAGNOSIS — Z79899 Other long term (current) drug therapy: Secondary | ICD-10-CM | POA: Diagnosis not present

## 2023-06-23 DIAGNOSIS — C44629 Squamous cell carcinoma of skin of left upper limb, including shoulder: Secondary | ICD-10-CM | POA: Diagnosis not present

## 2023-06-26 DIAGNOSIS — J441 Chronic obstructive pulmonary disease with (acute) exacerbation: Secondary | ICD-10-CM | POA: Diagnosis not present

## 2023-06-26 DIAGNOSIS — G4733 Obstructive sleep apnea (adult) (pediatric): Secondary | ICD-10-CM | POA: Diagnosis not present

## 2023-07-11 DIAGNOSIS — J449 Chronic obstructive pulmonary disease, unspecified: Secondary | ICD-10-CM | POA: Diagnosis not present

## 2023-07-24 ENCOUNTER — Emergency Department: Payer: HMO

## 2023-07-24 ENCOUNTER — Emergency Department
Admission: EM | Admit: 2023-07-24 | Discharge: 2023-07-24 | Disposition: A | Discharge status: Home / Self Care | Payer: HMO | Attending: Student in an Organized Health Care Education/Training Program | Admitting: Student in an Organized Health Care Education/Training Program

## 2023-07-24 ENCOUNTER — Encounter: Payer: Self-pay | Admitting: Emergency Medicine

## 2023-07-24 ENCOUNTER — Other Ambulatory Visit: Payer: Self-pay

## 2023-07-24 DIAGNOSIS — J45909 Unspecified asthma, uncomplicated: Secondary | ICD-10-CM | POA: Insufficient documentation

## 2023-07-24 DIAGNOSIS — S0083XA Contusion of other part of head, initial encounter: Secondary | ICD-10-CM | POA: Diagnosis present

## 2023-07-24 DIAGNOSIS — W19XXXA Unspecified fall, initial encounter: Secondary | ICD-10-CM

## 2023-07-24 DIAGNOSIS — I10 Essential (primary) hypertension: Secondary | ICD-10-CM | POA: Diagnosis not present

## 2023-07-24 DIAGNOSIS — S60511A Abrasion of right hand, initial encounter: Secondary | ICD-10-CM | POA: Insufficient documentation

## 2023-07-24 DIAGNOSIS — R519 Headache, unspecified: Secondary | ICD-10-CM | POA: Diagnosis not present

## 2023-07-24 DIAGNOSIS — J449 Chronic obstructive pulmonary disease, unspecified: Secondary | ICD-10-CM | POA: Diagnosis not present

## 2023-07-24 DIAGNOSIS — I672 Cerebral atherosclerosis: Secondary | ICD-10-CM | POA: Diagnosis not present

## 2023-07-24 DIAGNOSIS — W01198A Fall on same level from slipping, tripping and stumbling with subsequent striking against other object, initial encounter: Secondary | ICD-10-CM | POA: Insufficient documentation

## 2023-07-24 DIAGNOSIS — M542 Cervicalgia: Secondary | ICD-10-CM | POA: Diagnosis not present

## 2023-07-24 NOTE — ED Provider Triage Note (Signed)
Emergency Medicine Provider Triage Evaluation Note  Roy Nelson , a 77 y.o. male  was evaluated in triage.  Pt complains of tripped and fell in the parking lot. No LOC, no blood thinners.  Review of Systems  Positive: Face and hand pain Negative: Headache, blurry vision  Physical Exam  BP (!) 169/80 (BP Location: Left Arm)   Pulse 62   Temp 98.1 F (36.7 C) (Oral)   Resp 18   Ht 5\' 10"  (1.778 m)   Wt 87.5 kg   SpO2 92%   BMI 27.69 kg/m  Gen:   Awake, no distress   Resp:  Normal effort MSK:   Moves extremities without difficulty  Other:  Abrasions to the face, hand, covered with bandaids  Medical Decision Making  Medically screening exam initiated at 6:13 PM.  Appropriate orders placed.  [name] was informed that the remainder of the evaluation will be completed by another provider, this initial triage assessment does not replace that evaluation, and the importance of remaining in the ED until their evaluation is complete.     Cameron Ali, PA-C 07/24/23 1815

## 2023-07-24 NOTE — Discharge Instructions (Signed)
Wash the abrasions with soap and water.  You can apply triple antibiotic ointment and then cover with a bandage.  Please return to the ED with any new or worsening symptoms.

## 2023-07-24 NOTE — ED Provider Notes (Signed)
[facility] Provider Note    Event Date/Time   First MD Initiated Contact with Patient 07/24/23 2000     (approximate)   History   Fall   HPI  Roy Nelson is a 77 y.o. male with PMH of hypertension, COPD and asthma presents for evaluation after a fall.  Patient was picking something up when he lost his balance. He landed on his right hand and hit the right side of his face on the ground. No LOC and no blood thinners. Patient has multiple abrasions to the face and right hand that he covered with bandages. He denies any symptoms at this time.      Physical Exam   Triage Vital Signs: ED Triage Vitals [07/24/23 1811]  Encounter Vitals Group     BP (!) 169/80     Systolic BP Percentile      Diastolic BP Percentile      Pulse Rate 62     Resp 18     Temp 98.1 F (36.7 C)     Temp Source Oral     SpO2 92 %     Weight 193 lb (87.5 kg)     Height 5\' 10"  (1.778 m)     Head Circumference      Peak Flow      Pain Score 1     Pain Loc      Pain Education      Exclude from Growth Chart     Most recent vital signs: Vitals:   07/24/23 1811  BP: (!) 169/80  Pulse: 62  Resp: 18  Temp: 98.1 F (36.7 C)  SpO2: 92%    General: Awake, no distress.  CV:  Good peripheral perfusion. RRR. Resp:  Normal effort. CTAB. Abd:  No distention.  Other:  Multiple abrasions to the face, nose and right hand. Hematoma on the right forehead. PERRL. EOM intact. No ataxia. No focal neuro deficits.   ED Results / Procedures / Treatments   Labs (all labs ordered are listed, but only abnormal results are displayed) Labs Reviewed - No data to display   RADIOLOGY  CT head and neck obtained, I interpreted the images as well as reviewed the radiologist report, negative for any acute intracranial abnormalities, no traumatic alignment of c spine and no fractures.  PROCEDURES:  Critical Care performed: No  Procedures   MEDICATIONS ORDERED IN ED: Medications -  No data to display   IMPRESSION / MDM / ASSESSMENT AND PLAN / ED COURSE  I reviewed the triage vital signs and the nursing notes.                             77 year old male presents for evaluation after a fall.  Patient was hypertensive in triage otherwise vital signs are stable.  Patient NAD on exam and denies any symptoms.  Differential diagnosis includes, but is not limited to, concussion, contusion, abrasions, intracranial bleed, closed head injury.  Patient's presentation is most consistent with acute complicated illness / injury requiring diagnostic workup.  CT of the head and neck was negative.  Patient's physical exam is reassuring and he denies any complaints at this time.  Given his negative imaging, I feel he is safe for discharge.  He was given return precautions.  We reviewed wound care for the abrasions to the face and hands. He voiced understanding, all questions were answered he is stable at  discharge.      FINAL CLINICAL IMPRESSION(S) / ED DIAGNOSES   Final diagnoses:  Fall, initial encounter     Rx / DC Orders   ED Discharge Orders     None        Note:  This document was prepared using Dragon voice recognition software and may include unintentional dictation errors.   Cameron Ali, PA-C 07/24/23 2022    Phineas Semen, MD 07/24/23 2150

## 2023-07-24 NOTE — ED Triage Notes (Signed)
Patient to ED via POV after a fall. Patient was picking something up when he lost his balance. States he landed on his right hand and hit the right side of his face. Abrasions noted to the hand, nose, and right eyebrow. Bruising and swelling noted to the right eye. Denies LOC or blood thinners.

## 2023-08-06 DIAGNOSIS — G4734 Idiopathic sleep related nonobstructive alveolar hypoventilation: Secondary | ICD-10-CM | POA: Diagnosis not present

## 2023-08-06 DIAGNOSIS — G4733 Obstructive sleep apnea (adult) (pediatric): Secondary | ICD-10-CM | POA: Diagnosis not present

## 2023-08-06 DIAGNOSIS — J449 Chronic obstructive pulmonary disease, unspecified: Secondary | ICD-10-CM | POA: Diagnosis not present

## 2023-08-06 DIAGNOSIS — J301 Allergic rhinitis due to pollen: Secondary | ICD-10-CM | POA: Diagnosis not present

## 2023-08-10 DIAGNOSIS — J449 Chronic obstructive pulmonary disease, unspecified: Secondary | ICD-10-CM | POA: Diagnosis not present

## 2023-09-10 DIAGNOSIS — J449 Chronic obstructive pulmonary disease, unspecified: Secondary | ICD-10-CM | POA: Diagnosis not present

## 2023-10-11 DIAGNOSIS — J449 Chronic obstructive pulmonary disease, unspecified: Secondary | ICD-10-CM | POA: Diagnosis not present

## 2023-11-08 DIAGNOSIS — J449 Chronic obstructive pulmonary disease, unspecified: Secondary | ICD-10-CM | POA: Diagnosis not present

## 2023-11-24 DIAGNOSIS — D225 Melanocytic nevi of trunk: Secondary | ICD-10-CM | POA: Diagnosis not present

## 2023-11-24 DIAGNOSIS — D2271 Melanocytic nevi of right lower limb, including hip: Secondary | ICD-10-CM | POA: Diagnosis not present

## 2023-11-24 DIAGNOSIS — Z08 Encounter for follow-up examination after completed treatment for malignant neoplasm: Secondary | ICD-10-CM | POA: Diagnosis not present

## 2023-11-24 DIAGNOSIS — D485 Neoplasm of uncertain behavior of skin: Secondary | ICD-10-CM | POA: Diagnosis not present

## 2023-11-24 DIAGNOSIS — D0472 Carcinoma in situ of skin of left lower limb, including hip: Secondary | ICD-10-CM | POA: Diagnosis not present

## 2023-11-24 DIAGNOSIS — D2272 Melanocytic nevi of left lower limb, including hip: Secondary | ICD-10-CM | POA: Diagnosis not present

## 2023-11-24 DIAGNOSIS — L821 Other seborrheic keratosis: Secondary | ICD-10-CM | POA: Diagnosis not present

## 2023-11-24 DIAGNOSIS — D0361 Melanoma in situ of right upper limb, including shoulder: Secondary | ICD-10-CM | POA: Diagnosis not present

## 2023-11-24 DIAGNOSIS — Z85828 Personal history of other malignant neoplasm of skin: Secondary | ICD-10-CM | POA: Diagnosis not present

## 2023-11-24 DIAGNOSIS — D2261 Melanocytic nevi of right upper limb, including shoulder: Secondary | ICD-10-CM | POA: Diagnosis not present

## 2023-11-24 DIAGNOSIS — D0461 Carcinoma in situ of skin of right upper limb, including shoulder: Secondary | ICD-10-CM | POA: Diagnosis not present

## 2023-11-24 DIAGNOSIS — D2262 Melanocytic nevi of left upper limb, including shoulder: Secondary | ICD-10-CM | POA: Diagnosis not present

## 2023-11-24 DIAGNOSIS — L570 Actinic keratosis: Secondary | ICD-10-CM | POA: Diagnosis not present

## 2023-12-07 DIAGNOSIS — J301 Allergic rhinitis due to pollen: Secondary | ICD-10-CM | POA: Diagnosis not present

## 2023-12-07 DIAGNOSIS — G4733 Obstructive sleep apnea (adult) (pediatric): Secondary | ICD-10-CM | POA: Diagnosis not present

## 2023-12-07 DIAGNOSIS — J449 Chronic obstructive pulmonary disease, unspecified: Secondary | ICD-10-CM | POA: Diagnosis not present

## 2023-12-07 DIAGNOSIS — Z6831 Body mass index (BMI) 31.0-31.9, adult: Secondary | ICD-10-CM | POA: Diagnosis not present

## 2023-12-07 DIAGNOSIS — E66811 Obesity, class 1: Secondary | ICD-10-CM | POA: Diagnosis not present

## 2023-12-07 DIAGNOSIS — E6609 Other obesity due to excess calories: Secondary | ICD-10-CM | POA: Diagnosis not present

## 2023-12-09 DIAGNOSIS — J449 Chronic obstructive pulmonary disease, unspecified: Secondary | ICD-10-CM | POA: Diagnosis not present

## 2023-12-17 DIAGNOSIS — Z79899 Other long term (current) drug therapy: Secondary | ICD-10-CM | POA: Diagnosis not present

## 2023-12-17 DIAGNOSIS — E7800 Pure hypercholesterolemia, unspecified: Secondary | ICD-10-CM | POA: Diagnosis not present

## 2023-12-17 DIAGNOSIS — R7303 Prediabetes: Secondary | ICD-10-CM | POA: Diagnosis not present

## 2023-12-24 DIAGNOSIS — F419 Anxiety disorder, unspecified: Secondary | ICD-10-CM | POA: Diagnosis not present

## 2023-12-24 DIAGNOSIS — I10 Essential (primary) hypertension: Secondary | ICD-10-CM | POA: Diagnosis not present

## 2023-12-24 DIAGNOSIS — Z125 Encounter for screening for malignant neoplasm of prostate: Secondary | ICD-10-CM | POA: Diagnosis not present

## 2023-12-24 DIAGNOSIS — R7303 Prediabetes: Secondary | ICD-10-CM | POA: Diagnosis not present

## 2023-12-24 DIAGNOSIS — N179 Acute kidney failure, unspecified: Secondary | ICD-10-CM | POA: Diagnosis not present

## 2023-12-24 DIAGNOSIS — J4489 Other specified chronic obstructive pulmonary disease: Secondary | ICD-10-CM | POA: Diagnosis not present

## 2023-12-24 DIAGNOSIS — Z79899 Other long term (current) drug therapy: Secondary | ICD-10-CM | POA: Diagnosis not present

## 2023-12-24 DIAGNOSIS — E7800 Pure hypercholesterolemia, unspecified: Secondary | ICD-10-CM | POA: Diagnosis not present

## 2023-12-30 DIAGNOSIS — D0361 Melanoma in situ of right upper limb, including shoulder: Secondary | ICD-10-CM | POA: Diagnosis not present

## 2024-01-06 DIAGNOSIS — G4733 Obstructive sleep apnea (adult) (pediatric): Secondary | ICD-10-CM | POA: Diagnosis not present

## 2024-01-08 DIAGNOSIS — J449 Chronic obstructive pulmonary disease, unspecified: Secondary | ICD-10-CM | POA: Diagnosis not present

## 2024-01-13 DIAGNOSIS — D0472 Carcinoma in situ of skin of left lower limb, including hip: Secondary | ICD-10-CM | POA: Diagnosis not present

## 2024-01-13 DIAGNOSIS — D0461 Carcinoma in situ of skin of right upper limb, including shoulder: Secondary | ICD-10-CM | POA: Diagnosis not present

## 2024-01-28 DIAGNOSIS — J441 Chronic obstructive pulmonary disease with (acute) exacerbation: Secondary | ICD-10-CM | POA: Diagnosis not present

## 2024-01-28 DIAGNOSIS — G4733 Obstructive sleep apnea (adult) (pediatric): Secondary | ICD-10-CM | POA: Diagnosis not present

## 2024-02-06 DIAGNOSIS — G4733 Obstructive sleep apnea (adult) (pediatric): Secondary | ICD-10-CM | POA: Diagnosis not present

## 2024-02-08 DIAGNOSIS — J449 Chronic obstructive pulmonary disease, unspecified: Secondary | ICD-10-CM | POA: Diagnosis not present

## 2024-03-18 DIAGNOSIS — G4733 Obstructive sleep apnea (adult) (pediatric): Secondary | ICD-10-CM | POA: Diagnosis not present

## 2024-04-04 DIAGNOSIS — E7849 Other hyperlipidemia: Secondary | ICD-10-CM | POA: Diagnosis not present

## 2024-04-04 DIAGNOSIS — I10 Essential (primary) hypertension: Secondary | ICD-10-CM | POA: Diagnosis not present

## 2024-04-04 DIAGNOSIS — E66811 Obesity, class 1: Secondary | ICD-10-CM | POA: Diagnosis not present

## 2024-04-04 DIAGNOSIS — Z6831 Body mass index (BMI) 31.0-31.9, adult: Secondary | ICD-10-CM | POA: Diagnosis not present

## 2024-04-04 DIAGNOSIS — G4734 Idiopathic sleep related nonobstructive alveolar hypoventilation: Secondary | ICD-10-CM | POA: Diagnosis not present

## 2024-04-04 DIAGNOSIS — F419 Anxiety disorder, unspecified: Secondary | ICD-10-CM | POA: Diagnosis not present

## 2024-04-04 DIAGNOSIS — R0609 Other forms of dyspnea: Secondary | ICD-10-CM | POA: Diagnosis not present

## 2024-04-04 DIAGNOSIS — R251 Tremor, unspecified: Secondary | ICD-10-CM | POA: Diagnosis not present

## 2024-04-04 DIAGNOSIS — J4489 Other specified chronic obstructive pulmonary disease: Secondary | ICD-10-CM | POA: Diagnosis not present

## 2024-04-04 DIAGNOSIS — E6609 Other obesity due to excess calories: Secondary | ICD-10-CM | POA: Diagnosis not present

## 2024-04-06 ENCOUNTER — Other Ambulatory Visit: Payer: Self-pay | Admitting: Internal Medicine

## 2024-04-06 DIAGNOSIS — E7849 Other hyperlipidemia: Secondary | ICD-10-CM

## 2024-04-07 DIAGNOSIS — J301 Allergic rhinitis due to pollen: Secondary | ICD-10-CM | POA: Diagnosis not present

## 2024-04-07 DIAGNOSIS — G4733 Obstructive sleep apnea (adult) (pediatric): Secondary | ICD-10-CM | POA: Diagnosis not present

## 2024-04-07 DIAGNOSIS — J4489 Other specified chronic obstructive pulmonary disease: Secondary | ICD-10-CM | POA: Diagnosis not present

## 2024-04-07 DIAGNOSIS — R0602 Shortness of breath: Secondary | ICD-10-CM | POA: Diagnosis not present

## 2024-05-08 DIAGNOSIS — G4733 Obstructive sleep apnea (adult) (pediatric): Secondary | ICD-10-CM | POA: Diagnosis not present

## 2024-06-07 DIAGNOSIS — G4733 Obstructive sleep apnea (adult) (pediatric): Secondary | ICD-10-CM | POA: Diagnosis not present

## 2024-06-08 DIAGNOSIS — J441 Chronic obstructive pulmonary disease with (acute) exacerbation: Secondary | ICD-10-CM | POA: Diagnosis not present

## 2024-06-08 DIAGNOSIS — G4733 Obstructive sleep apnea (adult) (pediatric): Secondary | ICD-10-CM | POA: Diagnosis not present

## 2024-06-13 DIAGNOSIS — D0461 Carcinoma in situ of skin of right upper limb, including shoulder: Secondary | ICD-10-CM | POA: Diagnosis not present

## 2024-06-13 DIAGNOSIS — D2262 Melanocytic nevi of left upper limb, including shoulder: Secondary | ICD-10-CM | POA: Diagnosis not present

## 2024-06-13 DIAGNOSIS — D485 Neoplasm of uncertain behavior of skin: Secondary | ICD-10-CM | POA: Diagnosis not present

## 2024-06-13 DIAGNOSIS — D225 Melanocytic nevi of trunk: Secondary | ICD-10-CM | POA: Diagnosis not present

## 2024-06-13 DIAGNOSIS — Z86006 Personal history of melanoma in-situ: Secondary | ICD-10-CM | POA: Diagnosis not present

## 2024-06-13 DIAGNOSIS — L538 Other specified erythematous conditions: Secondary | ICD-10-CM | POA: Diagnosis not present

## 2024-06-13 DIAGNOSIS — D2261 Melanocytic nevi of right upper limb, including shoulder: Secondary | ICD-10-CM | POA: Diagnosis not present

## 2024-06-13 DIAGNOSIS — D0460 Carcinoma in situ of skin of unspecified upper limb, including shoulder: Secondary | ICD-10-CM | POA: Diagnosis not present

## 2024-06-13 DIAGNOSIS — L820 Inflamed seborrheic keratosis: Secondary | ICD-10-CM | POA: Diagnosis not present

## 2024-06-13 DIAGNOSIS — D0421 Carcinoma in situ of skin of right ear and external auricular canal: Secondary | ICD-10-CM | POA: Diagnosis not present

## 2024-06-13 DIAGNOSIS — L570 Actinic keratosis: Secondary | ICD-10-CM | POA: Diagnosis not present

## 2024-06-13 DIAGNOSIS — L821 Other seborrheic keratosis: Secondary | ICD-10-CM | POA: Diagnosis not present

## 2024-06-13 DIAGNOSIS — Z85820 Personal history of malignant melanoma of skin: Secondary | ICD-10-CM | POA: Diagnosis not present

## 2024-06-13 DIAGNOSIS — D2272 Melanocytic nevi of left lower limb, including hip: Secondary | ICD-10-CM | POA: Diagnosis not present

## 2024-06-13 DIAGNOSIS — M71371 Other bursal cyst, right ankle and foot: Secondary | ICD-10-CM | POA: Diagnosis not present

## 2024-06-24 DIAGNOSIS — R7303 Prediabetes: Secondary | ICD-10-CM | POA: Diagnosis not present

## 2024-06-24 DIAGNOSIS — Z79899 Other long term (current) drug therapy: Secondary | ICD-10-CM | POA: Diagnosis not present

## 2024-06-24 DIAGNOSIS — Z125 Encounter for screening for malignant neoplasm of prostate: Secondary | ICD-10-CM | POA: Diagnosis not present

## 2024-06-24 DIAGNOSIS — E7800 Pure hypercholesterolemia, unspecified: Secondary | ICD-10-CM | POA: Diagnosis not present

## 2024-07-01 DIAGNOSIS — Z1331 Encounter for screening for depression: Secondary | ICD-10-CM | POA: Diagnosis not present

## 2024-07-01 DIAGNOSIS — Z79899 Other long term (current) drug therapy: Secondary | ICD-10-CM | POA: Diagnosis not present

## 2024-07-01 DIAGNOSIS — Z0000 Encounter for general adult medical examination without abnormal findings: Secondary | ICD-10-CM | POA: Diagnosis not present

## 2024-07-01 DIAGNOSIS — I10 Essential (primary) hypertension: Secondary | ICD-10-CM | POA: Diagnosis not present

## 2024-07-01 DIAGNOSIS — F419 Anxiety disorder, unspecified: Secondary | ICD-10-CM | POA: Diagnosis not present

## 2024-07-01 DIAGNOSIS — R7303 Prediabetes: Secondary | ICD-10-CM | POA: Diagnosis not present

## 2024-07-01 DIAGNOSIS — E7800 Pure hypercholesterolemia, unspecified: Secondary | ICD-10-CM | POA: Diagnosis not present

## 2024-07-01 DIAGNOSIS — J4489 Other specified chronic obstructive pulmonary disease: Secondary | ICD-10-CM | POA: Diagnosis not present

## 2024-07-01 DIAGNOSIS — Z23 Encounter for immunization: Secondary | ICD-10-CM | POA: Diagnosis not present

## 2024-07-08 DIAGNOSIS — G4733 Obstructive sleep apnea (adult) (pediatric): Secondary | ICD-10-CM | POA: Diagnosis not present

## 2024-07-18 DIAGNOSIS — L820 Inflamed seborrheic keratosis: Secondary | ICD-10-CM | POA: Diagnosis not present

## 2024-07-18 DIAGNOSIS — L538 Other specified erythematous conditions: Secondary | ICD-10-CM | POA: Diagnosis not present

## 2024-07-18 DIAGNOSIS — D0461 Carcinoma in situ of skin of right upper limb, including shoulder: Secondary | ICD-10-CM | POA: Diagnosis not present

## 2024-07-18 DIAGNOSIS — L570 Actinic keratosis: Secondary | ICD-10-CM | POA: Diagnosis not present

## 2024-08-23 NOTE — Progress Notes (Signed)
 Roy Nelson                                          MRN: [id number]   08/23/2024   The [facility] Quality Team Specialist reviewed this patient medical record for the purposes of chart review for care gap closure. The following were reviewed: abstraction for care gap closure-controlling blood pressure.    [facility] Quality Team
